# Patient Record
Sex: Male | Born: 1980 | Race: Black or African American | Hispanic: No | Marital: Single | Smoking: Current every day smoker
Health system: Southern US, Community
[De-identification: ages and names within clinical notes are randomized; demographics above are authoritative.]

## PROBLEM LIST (undated history)

## (undated) DIAGNOSIS — I509 Heart failure, unspecified: Secondary | ICD-10-CM

## (undated) DIAGNOSIS — I4891 Unspecified atrial fibrillation: Secondary | ICD-10-CM

## (undated) DIAGNOSIS — I10 Essential (primary) hypertension: Secondary | ICD-10-CM

---

## 1999-06-14 ENCOUNTER — Emergency Department (HOSPITAL_COMMUNITY): Admission: EM | Admit: 1999-06-14 | Discharge: 1999-06-14 | Payer: Self-pay | Admitting: Emergency Medicine

## 2000-09-08 ENCOUNTER — Emergency Department (HOSPITAL_COMMUNITY): Admission: EM | Admit: 2000-09-08 | Discharge: 2000-09-08 | Payer: Self-pay | Admitting: Podiatry

## 2003-03-24 ENCOUNTER — Emergency Department (HOSPITAL_COMMUNITY): Admission: EM | Admit: 2003-03-24 | Discharge: 2003-03-24 | Payer: Self-pay | Admitting: Emergency Medicine

## 2003-03-24 ENCOUNTER — Encounter: Payer: Self-pay | Admitting: Emergency Medicine

## 2003-08-04 ENCOUNTER — Emergency Department (HOSPITAL_COMMUNITY): Admission: EM | Admit: 2003-08-04 | Discharge: 2003-08-04 | Payer: Self-pay

## 2003-08-16 ENCOUNTER — Emergency Department (HOSPITAL_COMMUNITY): Admission: EM | Admit: 2003-08-16 | Discharge: 2003-08-16 | Payer: Self-pay | Admitting: Emergency Medicine

## 2006-09-14 ENCOUNTER — Emergency Department (HOSPITAL_COMMUNITY): Admission: EM | Admit: 2006-09-14 | Discharge: 2006-09-14 | Payer: Self-pay | Admitting: Emergency Medicine

## 2009-02-26 ENCOUNTER — Emergency Department (HOSPITAL_COMMUNITY): Admission: EM | Admit: 2009-02-26 | Discharge: 2009-02-26 | Payer: Self-pay | Admitting: Emergency Medicine

## 2011-06-23 ENCOUNTER — Emergency Department (HOSPITAL_COMMUNITY)
Admission: EM | Admit: 2011-06-23 | Discharge: 2011-06-23 | Disposition: A | Discharge status: Home / Self Care | Payer: Self-pay | Attending: Emergency Medicine | Admitting: Emergency Medicine

## 2011-06-23 DIAGNOSIS — H5789 Other specified disorders of eye and adnexa: Secondary | ICD-10-CM | POA: Insufficient documentation

## 2011-06-23 DIAGNOSIS — H109 Unspecified conjunctivitis: Secondary | ICD-10-CM | POA: Insufficient documentation

## 2012-05-08 ENCOUNTER — Emergency Department (HOSPITAL_COMMUNITY): Payer: Self-pay

## 2012-05-08 ENCOUNTER — Encounter (HOSPITAL_COMMUNITY): Payer: Self-pay | Admitting: *Deleted

## 2012-05-08 ENCOUNTER — Inpatient Hospital Stay (HOSPITAL_COMMUNITY)
Admission: EM | Admit: 2012-05-08 | Discharge: 2012-05-17 | DRG: 291 | Disposition: A | Discharge status: Home / Self Care | Payer: MEDICAID | Source: Ambulatory Visit | Attending: Cardiovascular Disease | Admitting: Cardiovascular Disease

## 2012-05-08 DIAGNOSIS — I4891 Unspecified atrial fibrillation: Secondary | ICD-10-CM

## 2012-05-08 DIAGNOSIS — D75839 Thrombocytosis, unspecified: Secondary | ICD-10-CM

## 2012-05-08 DIAGNOSIS — R799 Abnormal finding of blood chemistry, unspecified: Secondary | ICD-10-CM | POA: Diagnosis present

## 2012-05-08 DIAGNOSIS — G9340 Encephalopathy, unspecified: Secondary | ICD-10-CM

## 2012-05-08 DIAGNOSIS — I959 Hypotension, unspecified: Secondary | ICD-10-CM

## 2012-05-08 DIAGNOSIS — I509 Heart failure, unspecified: Secondary | ICD-10-CM | POA: Diagnosis present

## 2012-05-08 DIAGNOSIS — I426 Alcoholic cardiomyopathy: Secondary | ICD-10-CM | POA: Diagnosis present

## 2012-05-08 DIAGNOSIS — I079 Rheumatic tricuspid valve disease, unspecified: Secondary | ICD-10-CM | POA: Diagnosis present

## 2012-05-08 DIAGNOSIS — E872 Acidosis, unspecified: Secondary | ICD-10-CM | POA: Diagnosis present

## 2012-05-08 DIAGNOSIS — I5021 Acute systolic (congestive) heart failure: Principal | ICD-10-CM

## 2012-05-08 DIAGNOSIS — K7010 Alcoholic hepatitis without ascites: Secondary | ICD-10-CM

## 2012-05-08 DIAGNOSIS — E871 Hypo-osmolality and hyponatremia: Secondary | ICD-10-CM

## 2012-05-08 DIAGNOSIS — N179 Acute kidney failure, unspecified: Secondary | ICD-10-CM

## 2012-05-08 DIAGNOSIS — M6282 Rhabdomyolysis: Secondary | ICD-10-CM | POA: Diagnosis present

## 2012-05-08 DIAGNOSIS — D473 Essential (hemorrhagic) thrombocythemia: Secondary | ICD-10-CM

## 2012-05-08 DIAGNOSIS — F1210 Cannabis abuse, uncomplicated: Secondary | ICD-10-CM | POA: Diagnosis present

## 2012-05-08 DIAGNOSIS — I429 Cardiomyopathy, unspecified: Secondary | ICD-10-CM

## 2012-05-08 DIAGNOSIS — F17200 Nicotine dependence, unspecified, uncomplicated: Secondary | ICD-10-CM | POA: Diagnosis present

## 2012-05-08 DIAGNOSIS — J189 Pneumonia, unspecified organism: Secondary | ICD-10-CM

## 2012-05-08 DIAGNOSIS — F1020 Alcohol dependence, uncomplicated: Secondary | ICD-10-CM | POA: Diagnosis present

## 2012-05-08 DIAGNOSIS — J9601 Acute respiratory failure with hypoxia: Secondary | ICD-10-CM

## 2012-05-08 DIAGNOSIS — I059 Rheumatic mitral valve disease, unspecified: Secondary | ICD-10-CM | POA: Diagnosis present

## 2012-05-08 DIAGNOSIS — J9600 Acute respiratory failure, unspecified whether with hypoxia or hypercapnia: Secondary | ICD-10-CM | POA: Diagnosis present

## 2012-05-08 LAB — URINALYSIS, ROUTINE W REFLEX MICROSCOPIC
Glucose, UA: NEGATIVE mg/dL
Hgb urine dipstick: NEGATIVE
Ketones, ur: NEGATIVE mg/dL
Leukocytes, UA: NEGATIVE
Protein, ur: NEGATIVE mg/dL
pH: 5.5 (ref 5.0–8.0)

## 2012-05-08 LAB — DIFFERENTIAL
Basophils Absolute: 0.1 10*3/uL (ref 0.0–0.1)
Eosinophils Absolute: 0.1 10*3/uL (ref 0.0–0.7)
Eosinophils Relative: 0 % (ref 0–5)
Lymphocytes Relative: 23 % (ref 12–46)
Monocytes Absolute: 1.5 10*3/uL — ABNORMAL HIGH (ref 0.1–1.0)

## 2012-05-08 LAB — BASIC METABOLIC PANEL
BUN: 9 mg/dL (ref 6–23)
CO2: 18 mEq/L — ABNORMAL LOW (ref 19–32)
Calcium: 9 mg/dL (ref 8.4–10.5)
Chloride: 101 mEq/L (ref 96–112)
Creatinine, Ser: 0.74 mg/dL (ref 0.50–1.35)

## 2012-05-08 LAB — CBC
HCT: 40.2 % (ref 39.0–52.0)
MCH: 29.9 pg (ref 26.0–34.0)
MCHC: 34.3 g/dL (ref 30.0–36.0)
MCV: 87.2 fL (ref 78.0–100.0)
Platelets: 1198 10*3/uL (ref 150–400)
RDW: 13.7 % (ref 11.5–15.5)
WBC: 13.9 10*3/uL — ABNORMAL HIGH (ref 4.0–10.5)

## 2012-05-08 LAB — GLUCOSE, CAPILLARY: Glucose-Capillary: 104 mg/dL — ABNORMAL HIGH (ref 70–99)

## 2012-05-08 MED ORDER — DILTIAZEM HCL 25 MG/5ML IV SOLN
INTRAVENOUS | Status: AC
  Administered 2012-05-08: 20 mg via INTRAVENOUS
  Filled 2012-05-08: qty 5

## 2012-05-08 MED ORDER — SODIUM CHLORIDE 0.9 % IV BOLUS (SEPSIS)
1000.0000 mL | Freq: Once | INTRAVENOUS | Status: AC
  Administered 2012-05-08: 1000 mL via INTRAVENOUS

## 2012-05-08 MED ORDER — METOPROLOL TARTRATE 1 MG/ML IV SOLN
5.0000 mg | Freq: Once | INTRAVENOUS | Status: AC
  Administered 2012-05-08: 5 mg via INTRAVENOUS
  Filled 2012-05-08: qty 5

## 2012-05-08 MED ORDER — DILTIAZEM HCL 100 MG IV SOLR
5.0000 mg/h | INTRAVENOUS | Status: AC
  Administered 2012-05-08: 5 mg/h via INTRAVENOUS
  Filled 2012-05-08: qty 100

## 2012-05-08 MED ORDER — GLUCAGON HCL (RDNA) 1 MG IJ SOLR
INTRAMUSCULAR | Status: AC
  Administered 2012-05-08: 1 mg
  Filled 2012-05-08: qty 1

## 2012-05-08 MED ORDER — LORAZEPAM 2 MG/ML IJ SOLN
INTRAMUSCULAR | Status: AC
  Administered 2012-05-08: 1 mg
  Filled 2012-05-08: qty 1

## 2012-05-08 MED ORDER — DILTIAZEM HCL 50 MG/10ML IV SOLN
20.0000 mg | Freq: Once | INTRAVENOUS | Status: AC
  Administered 2012-05-08: 20 mg via INTRAVENOUS
  Filled 2012-05-08: qty 4

## 2012-05-08 NOTE — ED Notes (Signed)
Report to blue side pt moved.

## 2012-05-08 NOTE — ED Provider Notes (Signed)
History     CSN: [id number]  Arrival date & time 05/08/12  2012   First MD Initiated Contact with Patient 05/08/12 2203      Chief Complaint  Patient presents with  . Leg Swelling  . Cough    Patient is a 31 y.o. male presenting with cough.  Cough Pertinent negatives include no chest pain, no chills and no shortness of breath.    History provided by the patient. Patient is a 31 year old male with no significant past medical history who presents with complaints of lower leg swelling. Patient states that he began to notice swelling this morning and was concerned that he was having elevated blood pressure. Patient states he was seen recently for upper respiratory symptoms and pinkeye. Patient has been using Mucinex for the symptoms with some improvement. he denies any swelling like this in the past. He denies any pain or numbness in the feet. Patient denies any chest pain or shortness of breath. Patient does state he has occasional fast heartbeat. Patient also states that he has felt dehydrated. Patient admits to poor liquid intake. Patient does mention drinking beer daily. Symptoms are described as mild. he denies any other aggravating or alleviating factors.    History reviewed. No pertinent past medical history.  History reviewed. No pertinent past surgical history.  History reviewed. No pertinent family history.  History  Substance Use Topics  . Smoking status: Current Everyday Smoker  . Smokeless tobacco: Not on file  . Alcohol Use: Yes      Review of Systems  Constitutional: Negative for fever and chills.  Respiratory: Negative for cough and shortness of breath.   Cardiovascular: Positive for palpitations and leg swelling. Negative for chest pain.  Gastrointestinal: Negative for nausea, vomiting, abdominal pain and diarrhea.  Genitourinary: Negative for frequency, hematuria, decreased urine volume and difficulty urinating.    Allergies  Review of patient's allergies  indicates no known allergies.  Home Medications   Current Outpatient Rx  Name Route Sig Dispense Refill  . GUAIFENESIN ER 600 MG PO TB12 Oral Take 1,200 mg by mouth 2 (two) times daily.    . IBUPROFEN 200 MG PO TABS Oral Take 200 mg by mouth every 6 (six) hours as needed. For pain related to cough    . ADULT MULTIVITAMIN W/MINERALS CH Oral Take 1 tablet by mouth daily.      BP 137/84  Pulse 60  Temp(Src) 97.6 F (36.4 C) (Oral)  Resp 16  SpO2 100%  Physical Exam  Nursing note and vitals reviewed. Constitutional: He is oriented to person, place, and time. He appears well-developed and well-nourished. No distress.  HENT:  Head: Normocephalic and atraumatic.  Cardiovascular: An irregular rhythm present. Tachycardia present.   Pulmonary/Chest: Effort normal and breath sounds normal. No respiratory distress. He has no wheezes. He has no rales.  Abdominal: Soft. He exhibits no distension. There is no tenderness. There is no rebound and no guarding.  Musculoskeletal: He exhibits edema.       2+ pitting edema bilateral feet and ankles. Normal dorsal pedal pulses. Normal sensation.  Neurological: He is alert and oriented to person, place, and time.  Skin: Skin is warm. No rash noted.  Psychiatric: He has a normal mood and affect. His behavior is normal.    ED Course  Procedures  CRITICAL CARE Performed by: Angus Seller   Total critical care time: 60  Critical care time was exclusive of separately billable procedures and treating other patients.  Critical care  was necessary to treat or prevent imminent or life-threatening deterioration.  Critical care was time spent personally by me on the following activities: development of treatment plan with patient and/or surrogate as well as nursing, discussions with consultants, evaluation of patient's response to treatment, examination of patient, obtaining history from patient or surrogate, ordering and performing treatments and  interventions, ordering and review of laboratory studies, ordering and review of radiographic studies, pulse oximetry and re-evaluation of patient's condition.      Results for orders placed during the hospital encounter of 05/08/12  CBC      Component Value Range   WBC 13.9 (*) 4.0 - 10.5 (K/uL)   RBC 4.61  4.22 - 5.81 (MIL/uL)   Hemoglobin 13.8  13.0 - 17.0 (g/dL)   HCT 78.2  95.6 - 21.3 (%)   MCV 87.2  78.0 - 100.0 (fL)   MCH 29.9  26.0 - 34.0 (pg)   MCHC 34.3  30.0 - 36.0 (g/dL)   RDW 08.6  57.8 - 46.9 (%)   Platelets 1198 (*) 150 - 400 (K/uL)  DIFFERENTIAL      Component Value Range   Neutrophils Relative 66  43 - 77 (%)   Neutro Abs 9.2 (*) 1.7 - 7.7 (K/uL)   Lymphocytes Relative 23  12 - 46 (%)   Lymphs Abs 3.1  0.7 - 4.0 (K/uL)   Monocytes Relative 11  3 - 12 (%)   Monocytes Absolute 1.5 (*) 0.1 - 1.0 (K/uL)   Eosinophils Relative 0  0 - 5 (%)   Eosinophils Absolute 0.1  0.0 - 0.7 (K/uL)   Basophils Relative 0  0 - 1 (%)   Basophils Absolute 0.1  0.0 - 0.1 (K/uL)  BASIC METABOLIC PANEL      Component Value Range   Sodium 134 (*) 135 - 145 (mEq/L)   Potassium 4.4  3.5 - 5.1 (mEq/L)   Chloride 101  96 - 112 (mEq/L)   CO2 18 (*) 19 - 32 (mEq/L)   Glucose, Bld 131 (*) 70 - 99 (mg/dL)   BUN 9  6 - 23 (mg/dL)   Creatinine, Ser 6.29  0.50 - 1.35 (mg/dL)   Calcium 9.0  8.4 - 52.8 (mg/dL)   GFR calc non Af Amer >90  >90 (mL/min)   GFR calc Af Amer >90  >90 (mL/min)  URINALYSIS, ROUTINE W REFLEX MICROSCOPIC      Component Value Range   Color, Urine AMBER (*) YELLOW    APPearance CLOUDY (*) CLEAR    Specific Gravity, Urine 1.020  1.005 - 1.030    pH 5.5  5.0 - 8.0    Glucose, UA NEGATIVE  NEGATIVE (mg/dL)   Hgb urine dipstick NEGATIVE  NEGATIVE    Bilirubin Urine MODERATE (*) NEGATIVE    Ketones, ur NEGATIVE  NEGATIVE (mg/dL)   Protein, ur NEGATIVE  NEGATIVE (mg/dL)   Urobilinogen, UA 1.0  0.0 - 1.0 (mg/dL)   Nitrite NEGATIVE  NEGATIVE    Leukocytes, UA NEGATIVE   NEGATIVE   PROTIME-INR      Component Value Range   Prothrombin Time 17.7 (*) 11.6 - 15.2 (seconds)   INR 1.43  0.00 - 1.49   HEPATIC FUNCTION PANEL      Component Value Range   Total Protein 7.6  6.0 - 8.3 (g/dL)   Albumin 3.1 (*) 3.5 - 5.2 (g/dL)   AST 413 (*) 0 - 37 (U/L)   ALT 192 (*) 0 - 53 (U/L)   Alkaline Phosphatase  175 (*) 39 - 117 (U/L)   Total Bilirubin 2.6 (*) 0.3 - 1.2 (mg/dL)   Bilirubin, Direct 1.6 (*) 0.0 - 0.3 (mg/dL)   Indirect Bilirubin 1.0 (*) 0.3 - 0.9 (mg/dL)  CARDIAC PANEL(CRET KIN+CKTOT+MB+TROPI)      Component Value Range   Total CK 144  7 - 232 (U/L)   CK, MB 2.4  0.3 - 4.0 (ng/mL)   Troponin I <0.30  <0.30 (ng/mL)   Relative Index 1.7  0.0 - 2.5   APTT      Component Value Range   aPTT 35  24 - 37 (seconds)  GLUCOSE, CAPILLARY      Component Value Range   Glucose-Capillary 104 (*) 70 - 99 (mg/dL)  ETHANOL      Component Value Range   Alcohol, Ethyl (B) <11  0 - 11 (mg/dL)  POCT I-STAT 3, BLOOD GAS (G3+)      Component Value Range   pH, Arterial 7.262 (*) 7.350 - 7.450    pCO2 arterial 32.2 (*) 35.0 - 45.0 (mmHg)   pO2, Arterial 37.0 (*) 80.0 - 100.0 (mmHg)   Bicarbonate 14.5 (*) 20.0 - 24.0 (mEq/L)   TCO2 15  0 - 100 (mmol/L)   O2 Saturation 63.0     Acid-base deficit 11.0 (*) 0.0 - 2.0 (mmol/L)   Collection site RADIAL, ALLEN'S TEST ACCEPTABLE     Drawn by Operator     Sample type ARTERIAL     Comment MD NOTIFIED, SUGGEST RECOLLECT        Dg Chest Portable 1 View  05/09/2012  *RADIOLOGY REPORT*  Clinical Data: Cough, leg edema.  PORTABLE CHEST - 1 VIEW  Comparison: None.  Findings: Cardiomegaly.  Central vascular congestion. Moderate bibasilar and mild perihilar opacities.  Vascular cephalization. No pneumothorax.  No definite pleural effusion.  No acute osseous finding.  IMPRESSION: Cardiomegaly with central vascular congestion.  Mild perihilar and moderate bibasilar opacities may represent edema or infection.  Original Report Authenticated  By: Waneta Martins, M.D.     1. New onset a-fib       MDM  Patient seen and evaluated. Patient no acute distress. Patient with a fast irregular heartbeat. Will obtain EKG and lab work.   10:45 PM EKG shows A. fib with RVR. Will move patient   Patient without any conversion and still are in 150s. Patient has been seen and evaluated with Dr. Manus Gunning.  Spoke with Dr. Patricia Pesa critical care. They will see and admit patient but it may take him 1-2 hours before they're able to come see the patient. Would like a cardiac ICU bed.   Have also spoken with on-call cardiology. They will come and evaluate patient for possible consideration of cardioversion.  Dr. Algie Coffer has seen patient and will admit.    Date: 05/08/2012  Rate: 191  Rhythm: atrial fibrillation with RVR  QRS Axis: normal  Intervals: normal  ST/T Wave abnormalities: nonspecific ST/T changes  Conduction Disutrbances:none  Narrative Interpretation:   Old EKG Reviewed: none available     Angus Seller, [place] 05/09/12 [phone]

## 2012-05-08 NOTE — ED Notes (Signed)
Pt ekg noted to be in AFIB with RVR rate of 191

## 2012-05-08 NOTE — ED Notes (Addendum)
Pt presented with cough and rapid AFib accompanied by Triage nurse.GCS 15.

## 2012-05-08 NOTE — ED Notes (Signed)
Pt states cough and leg swelling for 2 days. Pt has yellow tinge to sclarea, abdomen distended, +2 edema noted bilaterial legs. Pt states he is a daily drinker. Urine tea colored.

## 2012-05-08 NOTE — ED Notes (Signed)
Pt states that he was worried about his blood pressure, due to noticing that both his ankles and feet were swollen. Pt states that he was seen here and had increased BP but has not been diagonised with HTN. Pt states that he has also been having a cough for the past week with taking mucinex with no relief.

## 2012-05-09 ENCOUNTER — Encounter (HOSPITAL_COMMUNITY): Payer: Self-pay | Admitting: *Deleted

## 2012-05-09 ENCOUNTER — Inpatient Hospital Stay (HOSPITAL_COMMUNITY): Payer: Self-pay

## 2012-05-09 LAB — POCT I-STAT 3, ART BLOOD GAS (G3+)
Acid-base deficit: 12 mmol/L — ABNORMAL HIGH (ref 0.0–2.0)
Bicarbonate: 14.5 mEq/L — ABNORMAL LOW (ref 20.0–24.0)
O2 Saturation: 90 %
TCO2: 15 mmol/L (ref 0–100)
pCO2 arterial: 32.2 mmHg — ABNORMAL LOW (ref 35.0–45.0)
pCO2 arterial: 25.1 mmHg — ABNORMAL LOW (ref 35.0–45.0)
pH, Arterial: 7.262 — ABNORMAL LOW (ref 7.350–7.450)
pO2, Arterial: 63 mmHg — ABNORMAL LOW (ref 80.0–100.0)

## 2012-05-09 LAB — HEPATIC FUNCTION PANEL
ALT: 192 U/L — ABNORMAL HIGH (ref 0–53)
AST: 233 U/L — ABNORMAL HIGH (ref 0–37)
Albumin: 3.1 g/dL — ABNORMAL LOW (ref 3.5–5.2)
Alkaline Phosphatase: 175 U/L — ABNORMAL HIGH (ref 39–117)
Total Protein: 7.6 g/dL (ref 6.0–8.3)

## 2012-05-09 LAB — CBC
MCH: 29.5 pg (ref 26.0–34.0)
Platelets: 1040 10*3/uL (ref 150–400)
RBC: 4.38 MIL/uL (ref 4.22–5.81)
RDW: 13.7 % (ref 11.5–15.5)
WBC: 16.6 10*3/uL — ABNORMAL HIGH (ref 4.0–10.5)

## 2012-05-09 LAB — RAPID URINE DRUG SCREEN, HOSP PERFORMED
Cocaine: NOT DETECTED
Opiates: NOT DETECTED

## 2012-05-09 LAB — CARDIAC PANEL(CRET KIN+CKTOT+MB+TROPI)
CK, MB: 2.5 ng/mL (ref 0.3–4.0)
CK, MB: 2.8 ng/mL (ref 0.3–4.0)
Relative Index: 1.7 (ref 0.0–2.5)
Relative Index: 1.2 (ref 0.0–2.5)
Total CK: 206 U/L (ref 7–232)
Troponin I: <0.3 ng/mL (ref <0.30)
Troponin I: <0.3 ng/mL (ref <0.30)
Troponin I: <0.3 ng/mL (ref <0.30)
Troponin I: <0.3 ng/mL (ref <0.30)

## 2012-05-09 LAB — PRO B NATRIURETIC PEPTIDE: Pro B Natriuretic peptide (BNP): 2344 pg/mL — ABNORMAL HIGH (ref 0–125)

## 2012-05-09 LAB — BASIC METABOLIC PANEL
Chloride: 101 mEq/L (ref 96–112)
GFR calc Af Amer: >90 mL/min (ref >90)
GFR calc non Af Amer: >90 mL/min (ref >90)
Potassium: 4.2 mEq/L (ref 3.5–5.1)
Sodium: 137 mEq/L (ref 135–145)

## 2012-05-09 LAB — MRSA PCR SCREENING: MRSA by PCR: NEGATIVE

## 2012-05-09 LAB — APTT: aPTT: 35 seconds (ref 24–37)

## 2012-05-09 MED ORDER — METOPROLOL TARTRATE 1 MG/ML IV SOLN
5.0000 mg | Freq: Once | INTRAVENOUS | Status: AC
  Administered 2012-05-09: 5 mg via INTRAVENOUS
  Filled 2012-05-09: qty 5

## 2012-05-09 MED ORDER — DIGOXIN 0.25 MG/ML IJ SOLN
0.2500 mg | Freq: Once | INTRAMUSCULAR | Status: AC
  Administered 2012-05-09: 0.25 mg via INTRAVENOUS

## 2012-05-09 MED ORDER — SODIUM CHLORIDE 0.9 % IJ SOLN: 3.0000 mL | INTRAMUSCULAR | Status: DC | PRN

## 2012-05-09 MED ORDER — DIGOXIN 125 MCG PO TABS
0.1250 mg | ORAL_TABLET | Freq: Every day | ORAL | Status: DC
  Filled 2012-05-09: qty 1

## 2012-05-09 MED ORDER — SODIUM CHLORIDE 0.9 % IJ SOLN
3.0000 mL | Freq: Two times a day (BID) | INTRAMUSCULAR | Status: DC
  Administered 2012-05-09: 3 mL via INTRAVENOUS
  Administered 2012-05-10: 10:00:00 via INTRAVENOUS
  Administered 2012-05-10 – 2012-05-13 (×7): 3 mL via INTRAVENOUS

## 2012-05-09 MED ORDER — DIGOXIN 0.25 MG/ML IJ SOLN
0.2500 mg | Freq: Every day | INTRAMUSCULAR | Status: AC
  Administered 2012-05-09: 0.25 mg via INTRAVENOUS

## 2012-05-09 MED ORDER — VITAMIN B-1 100 MG PO TABS
100.0000 mg | ORAL_TABLET | Freq: Every day | ORAL | Status: DC
  Administered 2012-05-09 – 2012-05-10 (×2): 100 mg via ORAL
  Filled 2012-05-09 (×2): qty 1

## 2012-05-09 MED ORDER — DILTIAZEM HCL 60 MG PO TABS
60.0000 mg | ORAL_TABLET | Freq: Four times a day (QID) | ORAL | Status: DC
  Administered 2012-05-09 (×2): 60 mg via ORAL
  Filled 2012-05-09 (×4): qty 1

## 2012-05-09 MED ORDER — SODIUM CHLORIDE 0.9 % IV BOLUS (SEPSIS)
250.0000 mL | Freq: Once | INTRAVENOUS | Status: AC
  Administered 2012-05-09: 250 mL via INTRAVENOUS

## 2012-05-09 MED ORDER — LORAZEPAM 2 MG/ML IJ SOLN
1.0000 mg | Freq: Four times a day (QID) | INTRAMUSCULAR | Status: DC | PRN
  Administered 2012-05-10: 13:00:00 via INTRAVENOUS
  Filled 2012-05-09 (×3): qty 1

## 2012-05-09 MED ORDER — LORAZEPAM 1 MG PO TABS
1.0000 mg | ORAL_TABLET | Freq: Four times a day (QID) | ORAL | Status: DC | PRN
  Administered 2012-05-09: 1 mg via ORAL
  Filled 2012-05-09: qty 1

## 2012-05-09 MED ORDER — FOLIC ACID 1 MG PO TABS
1.0000 mg | ORAL_TABLET | Freq: Every day | ORAL | Status: DC
  Administered 2012-05-09 – 2012-05-10 (×2): 1 mg via ORAL
  Filled 2012-05-09 (×2): qty 1

## 2012-05-09 MED ORDER — DIGOXIN 250 MCG PO TABS
0.2500 mg | ORAL_TABLET | Freq: Every day | ORAL | Status: DC
  Filled 2012-05-09: qty 1

## 2012-05-09 MED ORDER — LISINOPRIL 2.5 MG PO TABS
2.5000 mg | ORAL_TABLET | Freq: Every day | ORAL | Status: DC
  Administered 2012-05-09: 2.5 mg via ORAL
  Filled 2012-05-09: qty 1

## 2012-05-09 MED ORDER — LORAZEPAM 2 MG/ML IJ SOLN: 0.0000 mg | Freq: Two times a day (BID) | INTRAMUSCULAR | Status: DC

## 2012-05-09 MED ORDER — ENOXAPARIN SODIUM 40 MG/0.4ML ~~LOC~~ SOLN
40.0000 mg | SUBCUTANEOUS | Status: DC
  Filled 2012-05-09 (×2): qty 0.4

## 2012-05-09 MED ORDER — ACETAMINOPHEN 325 MG PO TABS: 650.0000 mg | ORAL_TABLET | ORAL | Status: DC | PRN

## 2012-05-09 MED ORDER — FUROSEMIDE 10 MG/ML IJ SOLN
20.0000 mg | Freq: Two times a day (BID) | INTRAMUSCULAR | Status: DC
  Administered 2012-05-09: 20 mg via INTRAVENOUS
  Filled 2012-05-09 (×3): qty 2

## 2012-05-09 MED ORDER — SODIUM CHLORIDE 0.9 % IV SOLN
250.0000 mL | INTRAVENOUS | Status: DC | PRN
  Administered 2012-05-09 (×2): 250 mL via INTRAVENOUS

## 2012-05-09 MED ORDER — FUROSEMIDE 10 MG/ML IJ SOLN
80.0000 mg | Freq: Once | INTRAMUSCULAR | Status: AC
  Administered 2012-05-09: 80 mg via INTRAVENOUS
  Filled 2012-05-09: qty 8

## 2012-05-09 MED ORDER — DIGOXIN 0.25 MG/ML IJ SOLN
INTRAMUSCULAR | Status: AC
  Filled 2012-05-09: qty 2

## 2012-05-09 MED ORDER — PNEUMOCOCCAL VAC POLYVALENT 25 MCG/0.5ML IJ INJ
0.5000 mL | INJECTION | INTRAMUSCULAR | Status: AC
  Administered 2012-05-10: 0.5 mL via INTRAMUSCULAR
  Filled 2012-05-09: qty 0.5

## 2012-05-09 MED ORDER — ADULT MULTIVITAMIN W/MINERALS CH
1.0000 | ORAL_TABLET | Freq: Every day | ORAL | Status: DC
  Administered 2012-05-09 – 2012-05-10 (×2): 1 via ORAL
  Filled 2012-05-09 (×2): qty 1

## 2012-05-09 MED ORDER — ONDANSETRON HCL 4 MG/2ML IJ SOLN
4.0000 mg | Freq: Four times a day (QID) | INTRAMUSCULAR | Status: DC | PRN
  Administered 2012-05-10: 4 mg via INTRAVENOUS
  Filled 2012-05-09: qty 2

## 2012-05-09 MED ORDER — LORAZEPAM 2 MG/ML IJ SOLN
0.0000 mg | Freq: Four times a day (QID) | INTRAMUSCULAR | Status: AC
  Administered 2012-05-09 (×2): 2 mg via INTRAVENOUS
  Administered 2012-05-09: 1 mg via INTRAVENOUS
  Administered 2012-05-10: 2 mg via INTRAVENOUS
  Filled 2012-05-09 (×3): qty 1

## 2012-05-09 MED ORDER — ASPIRIN EC 81 MG PO TBEC
81.0000 mg | DELAYED_RELEASE_TABLET | Freq: Every day | ORAL | Status: DC
  Administered 2012-05-09 – 2012-05-11 (×3): 81 mg via ORAL
  Filled 2012-05-09 (×4): qty 1

## 2012-05-09 MED ORDER — DILTIAZEM HCL 30 MG PO TABS
30.0000 mg | ORAL_TABLET | Freq: Four times a day (QID) | ORAL | Status: DC
  Administered 2012-05-10: 30 mg via ORAL
  Filled 2012-05-09 (×5): qty 1

## 2012-05-09 MED ORDER — SODIUM CHLORIDE 0.9 % IV BOLUS (SEPSIS)
1000.0000 mL | Freq: Once | INTRAVENOUS | Status: AC
  Administered 2012-05-09: 1000 mL via INTRAVENOUS

## 2012-05-09 MED ORDER — POTASSIUM CHLORIDE CRYS ER 20 MEQ PO TBCR
20.0000 meq | EXTENDED_RELEASE_TABLET | Freq: Every day | ORAL | Status: DC
  Administered 2012-05-09 – 2012-05-10 (×2): 20 meq via ORAL
  Filled 2012-05-09 (×2): qty 1

## 2012-05-09 MED ORDER — THIAMINE HCL 100 MG/ML IJ SOLN
100.0000 mg | Freq: Every day | INTRAMUSCULAR | Status: DC
  Filled 2012-05-09 (×2): qty 1

## 2012-05-09 MED ORDER — SODIUM CHLORIDE 0.9 % IV SOLN
Freq: Once | INTRAVENOUS | Status: AC
  Administered 2012-05-09: 23:00:00 via INTRAVENOUS

## 2012-05-09 MED ORDER — DILTIAZEM HCL 25 MG/5ML IV SOLN
10.0000 mg | Freq: Once | INTRAVENOUS | Status: AC
  Administered 2012-05-09: 10 mg via INTRAVENOUS
  Filled 2012-05-09: qty 5

## 2012-05-09 MED ORDER — CARVEDILOL 3.125 MG PO TABS
3.1250 mg | ORAL_TABLET | Freq: Two times a day (BID) | ORAL | Status: DC
  Administered 2012-05-09 – 2012-05-17 (×18): 3.125 mg via ORAL
  Filled 2012-05-09 (×20): qty 1

## 2012-05-09 MED ORDER — DILTIAZEM HCL 100 MG IV SOLR
10.0000 mg/h | INTRAVENOUS | Status: DC
  Administered 2012-05-09 (×2): 10 mg/h via INTRAVENOUS
  Filled 2012-05-09: qty 100

## 2012-05-09 NOTE — H&P (Signed)
Matthew Joseph is an 31 y.o. male.   Chief Complaint: Leg swelling and cough. HPI: 31 years old black male with h/o alcohol abuse has leg edema and cough worsening over few days. No fever. No chest pain.  Past medical history:-No diabetes, no hypertension, + smoking, + alcohol, no hyperlipidemia, No CVA, No obesity, No exercise.  Past surgical history:- None  Family history: Mom, living, 57 years and has hypertension. Dad-living, 58 years has hypertension. One brother, 81 years old. No sister.    Social History:  reports that he has been smoking.  He does not have any smokeless tobacco history on file. He reports that he drinks alcohol. He reports that he does use illicit drugs.   Personal: Single, One son and four daughters. 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  Allergies: No Known Allergies   Results for orders placed during the hospital encounter of 05/08/12 (from the past 48 hour(s))  CBC     Status: Abnormal   Collection Time   05/08/12 10:31 PM      Component Value Range Comment   WBC 13.9 (*) 4.0 - 10.5 (K/uL)    RBC 4.61  4.22 - 5.81 (MIL/uL)    Hemoglobin 13.8  13.0 - 17.0 (g/dL)    HCT 16.1  09.6 - 04.5 (%)    MCV 87.2  78.0 - 100.0 (fL)    MCH 29.9  26.0 - 34.0 (pg)    MCHC 34.3  30.0 - 36.0 (g/dL)    RDW 40.9  81.1 - 91.4 (%)    Platelets 1198 (*) 150 - 400 (K/uL)   DIFFERENTIAL     Status: Abnormal   Collection Time   05/08/12 10:31 PM      Component Value Range Comment   Neutrophils Relative 66  43 - 77 (%)    Neutro Abs 9.2 (*) 1.7 - 7.7 (K/uL)    Lymphocytes Relative 23  12 - 46 (%)    Lymphs Abs 3.1  0.7 - 4.0 (K/uL)    Monocytes Relative 11  3 - 12 (%)    Monocytes Absolute 1.5 (*) 0.1 - 1.0 (K/uL)    Eosinophils Relative 0  0 - 5 (%)    Eosinophils Absolute 0.1  0.0 - 0.7 (K/uL)    Basophils Relative 0  0 - 1 (%)    Basophils Absolute 0.1  0.0 - 0.1 (K/uL)   BASIC METABOLIC PANEL     Status: Abnormal   Collection Time   05/08/12 10:31 PM   Component Value Range Comment   Sodium 134 (*) 135 - 145 (mEq/L)    Potassium 4.4  3.5 - 5.1 (mEq/L)    Chloride 101  96 - 112 (mEq/L)    CO2 18 (*) 19 - 32 (mEq/L)    Glucose, Bld 131 (*) 70 - 99 (mg/dL)    BUN 9  6 - 23 (mg/dL)    Creatinine, Ser 7.82  0.50 - 1.35 (mg/dL)    Calcium 9.0  8.4 - 10.5 (mg/dL)    GFR calc non Af Amer >90  >90 (mL/min)    GFR calc Af Amer >90  >90 (mL/min)   URINALYSIS, ROUTINE W REFLEX MICROSCOPIC     Status: Abnormal   Collection Time   05/08/12 10:37 PM      Component Value Range Comment   Color, Urine AMBER (*) YELLOW  BIOCHEMICALS MAY BE AFFECTED BY COLOR   APPearance CLOUDY (*) CLEAR     Specific Gravity, Urine 1.020  1.005 - 1.030     pH 5.5  5.0 - 8.0     Glucose, UA NEGATIVE  NEGATIVE (mg/dL)    Hgb urine dipstick NEGATIVE  NEGATIVE     Bilirubin Urine MODERATE (*) NEGATIVE     Ketones, ur NEGATIVE  NEGATIVE (mg/dL)    Protein, ur NEGATIVE  NEGATIVE (mg/dL)    Urobilinogen, UA 1.0  0.0 - 1.0 (mg/dL)    Nitrite NEGATIVE  NEGATIVE     Leukocytes, UA NEGATIVE  NEGATIVE  MICROSCOPIC NOT DONE ON URINES WITH NEGATIVE PROTEIN, BLOOD, LEUKOCYTES, NITRITE, OR GLUCOSE <1000 mg/dL.  PROTIME-INR     Status: Abnormal   Collection Time   05/08/12 11:27 PM      Component Value Range Comment   Prothrombin Time 17.7 (*) 11.6 - 15.2 (seconds)    INR 1.43  0.00 - 1.49    HEPATIC FUNCTION PANEL     Status: Abnormal   Collection Time   05/08/12 11:27 PM      Component Value Range Comment   Total Protein 7.6  6.0 - 8.3 (g/dL)    Albumin 3.1 (*) 3.5 - 5.2 (g/dL)    AST 161 (*) 0 - 37 (U/L)    ALT 192 (*) 0 - 53 (U/L)    Alkaline Phosphatase 175 (*) 39 - 117 (U/L)    Total Bilirubin 2.6 (*) 0.3 - 1.2 (mg/dL)    Bilirubin, Direct 1.6 (*) 0.0 - 0.3 (mg/dL)    Indirect Bilirubin 1.0 (*) 0.3 - 0.9 (mg/dL)   CARDIAC PANEL(CRET KIN+CKTOT+MB+TROPI)     Status: Normal   Collection Time   05/08/12 11:27 PM      Component Value Range Comment   Total CK 144  7 - 232  (U/L)    CK, MB 2.4  0.3 - 4.0 (ng/mL)    Troponin I <0.30  <0.30 (ng/mL)    Relative Index 1.7  0.0 - 2.5    APTT     Status: Normal   Collection Time   05/08/12 11:27 PM      Component Value Range Comment   aPTT 35  24 - 37 (seconds)   GLUCOSE, CAPILLARY     Status: Abnormal   Collection Time   05/08/12 11:50 PM      Component Value Range Comment   Glucose-Capillary 104 (*) 70 - 99 (mg/dL)   ETHANOL     Status: Normal   Collection Time   05/09/12 12:22 AM      Component Value Range Comment   Alcohol, Ethyl (B) <11  0 - 11 (mg/dL)   POCT I-STAT 3, BLOOD GAS (G3+)     Status: Abnormal   Collection Time   05/09/12  1:35 AM      Component Value Range Comment   pH, Arterial 7.262 (*) 7.350 - 7.450     pCO2 arterial 32.2 (*) 35.0 - 45.0 (mmHg)    pO2, Arterial 37.0 (*) 80.0 - 100.0 (mmHg)    Bicarbonate 14.5 (*) 20.0 - 24.0 (mEq/L)    TCO2 15  0 - 100 (mmol/L)    O2 Saturation 63.0      Acid-base deficit 11.0 (*) 0.0 - 2.0 (mmol/L)    Collection site RADIAL, ALLEN'S TEST ACCEPTABLE      Drawn by Operator      Sample type ARTERIAL      Comment MD NOTIFIED, SUGGEST RECOLLECT      Dg Chest Portable 1 View  05/09/2012  *RADIOLOGY REPORT*  Clinical  Data: Cough, leg edema.  PORTABLE CHEST - 1 VIEW  Comparison: None.  Findings: Cardiomegaly.  Central vascular congestion. Moderate bibasilar and mild perihilar opacities.  Vascular cephalization. No pneumothorax.  No definite pleural effusion.  No acute osseous finding.  IMPRESSION: Cardiomegaly with central vascular congestion.  Mild perihilar and moderate bibasilar opacities may represent edema or infection.  Original Report Authenticated By: Waneta Martins, M.D.    @ROS @ No vision change, No hearing loss, No weight gain or loss, no asthma, No chest pain, no pneumonia, no GI bleed, no hepatitis, no kidney stone, No CVA, seizures, or psych admission.  Blood pressure 107/73, pulse 62, temperature 97.6 F (36.4 C), temperature source Oral,  resp. rate 47, SpO2 100.00%. General appearance: cooperative and fatigued Head: Normocephalic, without obvious abnormality, atraumatic Eyes: Brown,conjunctivae/corneas clear. PERRL, EOM's intact. Throat: lips, mucosa, and tongue normal; teeth and gums normal Neck: no adenopathy, no carotid bruit, no JVD, supple, symmetrical, trachea midline and thyroid not enlarged, symmetric Resp: rhonchi bibasilar Cardio: irregularly irregular rhythm. S1, S2 normal. GI: soft, non-tender; bowel sounds normal. Extremities: extremities normal, atraumatic, no cyanosis. 1 + edema Neurologic: Alert and oriented X 3, normal strength and tone. Normal symmetric reflexes. Normal coordination and gait  Assessment/Plan Acute left heart systolic failure Atrial fibrillation H/O smoking H/O alcohol use disorder H/O drug use  Plan: IV lanoxin, cardizem and lopressor as tolerated Step-down. Echocardiogram Oxygen IV Lasix as tolerated  Adric Wrede S 05/09/2012, 2:53 AM

## 2012-05-09 NOTE — ED Notes (Signed)
Pt looked flushed and complained of feeling unwell.EKG and CBG done.Seen by ED doctor and medications given as ordered.

## 2012-05-09 NOTE — ED Provider Notes (Signed)
Medical screening examination/treatment/procedure(s) were conducted as a shared visit with non-physician practitioner(s) and myself.  I personally evaluated the patient during the encounter  Leg swelling and cough x 2 days. Found to be in Afib with RVR. Bibasilar crackles.  IV cardizem controlling rates to only 140s.  BP ok.  Cardiomegaly with congestions. Lasix given. Remains tachypneic in 50s without hypoxia. Suspect component of alcohol withdrawal (72 ounces of beer daily, none since yesterday). Ativan given. bipap started with decrease in work of breathing. Cardioversion not attempted given unknown time of onset and stability of blood pressure. D/w CCM and Cardiology Dr. Algie Coffer.  CRITICAL CARE Performed by: Glynn Octave   Total critical care time: 60  Critical care time was exclusive of separately billable procedures and treating other patients.  Critical care was necessary to treat or prevent imminent or life-threatening deterioration.  Critical care was time spent personally by me on the following activities: development of treatment plan with patient and/or surrogate as well as nursing, discussions with consultants, evaluation of patient's response to treatment, examination of patient, obtaining history from patient or surrogate, ordering and performing treatments and interventions, ordering and review of laboratory studies, ordering and review of radiographic studies, pulse oximetry and re-evaluation of patient's condition.   Glynn Octave, MD 05/09/12 1204

## 2012-05-09 NOTE — Progress Notes (Signed)
Subjective:  Resting comfortably. HR in 90's. Awaiting 2-D echo.  Objective:  Vital Signs in the last 24 hours: Temp:  [97.6 F (36.4 C)-97.8 F (36.6 C)] 97.8 F (36.6 C) (06/09 0744) Pulse Rate:  [42-203] 45  (06/09 0700) Cardiac Rhythm:  [-] Atrial fibrillation (06/09 0419) Resp:  [16-54] 30  (06/09 0700) BP: (84-137)/(18-101) 109/71 mmHg (06/09 0607) SpO2:  [98 %-100 %] 98 % (06/09 0700) FiO2 (%):  [30 %] 30 % (06/09 0600) Weight:  [67.1 kg (147 lb 14.9 oz)] 67.1 kg (147 lb 14.9 oz) (06/09 0407)  Physical Exam: BP Readings from Last 1 Encounters:  05/09/12 109/71    Wt Readings from Last 1 Encounters:  05/09/12 67.1 kg (147 lb 14.9 oz)    Weight change:   HEENT: [place]/AT, Eyes-Brown, PERL, EOMI, Conjunctiva-Pink, Sclera-Non-icteric Neck: No JVD, No bruit, Trachea midline. Lungs:  Clearing, Bilateral. Cardiac:  Regular rhythm, normal S1 and S2, no S3.  Abdomen:  Soft, non-tender. Extremities:  1 + edema present. No cyanosis. No clubbing. CNS: AxOx3, Cranial nerves grossly intact, moves all 4 extremities. Right handed. Skin: Warm and dry.   Intake/Output from previous day: 06/08 0701 - 06/09 0700 In: 60 [I.V.:60] Out: 3475 [Urine:3475]    Lab Results: BMET    Component Value Date/Time   NA 134* 05/08/2012 2231   K 4.4 05/08/2012 2231   CL 101 05/08/2012 2231   CO2 18* 05/08/2012 2231   GLUCOSE 131* 05/08/2012 2231   BUN 9 05/08/2012 2231   CREATININE 0.74 05/08/2012 2231   CALCIUM 9.0 05/08/2012 2231   GFRNONAA >90 05/08/2012 2231   GFRAA >90 05/08/2012 2231   CBC    Component Value Date/Time   WBC 13.9* 05/08/2012 2231   RBC 4.61 05/08/2012 2231   HGB 13.8 05/08/2012 2231   HCT 40.2 05/08/2012 2231   PLT 1198* 05/08/2012 2231   MCV 87.2 05/08/2012 2231   MCH 29.9 05/08/2012 2231   MCHC 34.3 05/08/2012 2231   RDW 13.7 05/08/2012 2231   LYMPHSABS 3.1 05/08/2012 2231   MONOABS 1.5* 05/08/2012 2231   EOSABS 0.1 05/08/2012 2231   BASOSABS 0.1 05/08/2012 2231   CARDIAC ENZYMES Lab Results    Component Value Date   CKTOTAL 144 05/08/2012   CKMB 2.4 05/08/2012   TROPONINI <0.30 05/08/2012    Assessment/Plan:  Patient Active Hospital Problem List:  Acute left heart systolic failure  Atrial fibrillation  H/O smoking  H/O alcohol use disorder  H/O drug use  Adjust medications and check lab.   LOS: 1 day    Orpah Cobb  MD  05/09/2012, 8:54 AM

## 2012-05-09 NOTE — Progress Notes (Signed)
Pt is wanting to leave the hospital; Dr. Algie Coffer notified; pt has pulled out 3 IVs because he feels tied down and has been wanting to leave since about 2100; I have tried to reason with the patient to remain in hospital for treatment; pt is insisting on leaving; he is irritated by all the wires that are attached to him, but he is not confused; pt is alert and oriented x4; pt in agreement for me to call his mother; mother contacted, Fredrik Rigger, she doesn't want him to leave, but explained that I could not legally keep him against his will; she is going to call him; pt wants to call his mom; I called his mother from the room and let him talk with her.  Pt is currently talking with his mom; pt attempted to get up to get dressed and stumbled; pt then in agreement he was not able to walk out on his own; pt for the moment has agreed to stay

## 2012-05-09 NOTE — ED Notes (Signed)
Pt feeling better and with GCS 15.

## 2012-05-10 ENCOUNTER — Inpatient Hospital Stay (HOSPITAL_COMMUNITY): Payer: Self-pay

## 2012-05-10 DIAGNOSIS — I5021 Acute systolic (congestive) heart failure: Principal | ICD-10-CM

## 2012-05-10 DIAGNOSIS — N179 Acute kidney failure, unspecified: Secondary | ICD-10-CM

## 2012-05-10 DIAGNOSIS — J9600 Acute respiratory failure, unspecified whether with hypoxia or hypercapnia: Secondary | ICD-10-CM

## 2012-05-10 DIAGNOSIS — G9340 Encephalopathy, unspecified: Secondary | ICD-10-CM

## 2012-05-10 DIAGNOSIS — J189 Pneumonia, unspecified organism: Secondary | ICD-10-CM

## 2012-05-10 DIAGNOSIS — D473 Essential (hemorrhagic) thrombocythemia: Secondary | ICD-10-CM

## 2012-05-10 DIAGNOSIS — K7010 Alcoholic hepatitis without ascites: Secondary | ICD-10-CM

## 2012-05-10 DIAGNOSIS — I959 Hypotension, unspecified: Secondary | ICD-10-CM

## 2012-05-10 LAB — CARDIAC PANEL(CRET KIN+CKTOT+MB+TROPI)
CK, MB: 3.1 ng/mL (ref 0.3–4.0)
Relative Index: 1.5 (ref 0.0–2.5)
Total CK: 204 U/L (ref 7–232)
Troponin I: <0.3 ng/mL (ref <0.30)

## 2012-05-10 LAB — CBC
MCHC: 32.8 g/dL (ref 30.0–36.0)
Platelets: 953 10*3/uL (ref 150–400)
RDW: 14.2 % (ref 11.5–15.5)
WBC: 23.5 10*3/uL — ABNORMAL HIGH (ref 4.0–10.5)

## 2012-05-10 LAB — BASIC METABOLIC PANEL
BUN: 19 mg/dL (ref 6–23)
BUN: 25 mg/dL — ABNORMAL HIGH (ref 6–23)
Calcium: 8.5 mg/dL (ref 8.4–10.5)
Chloride: 97 mEq/L (ref 96–112)
Creatinine, Ser: 1.93 mg/dL — ABNORMAL HIGH (ref 0.50–1.35)
Creatinine, Ser: 1.48 mg/dL — ABNORMAL HIGH (ref 0.50–1.35)
GFR calc Af Amer: 51 mL/min — ABNORMAL LOW (ref >90)
GFR calc Af Amer: 70 mL/min — ABNORMAL LOW (ref >90)
GFR calc non Af Amer: 61 mL/min — ABNORMAL LOW (ref >90)

## 2012-05-10 LAB — HEPARIN LEVEL (UNFRACTIONATED): Heparin Unfractionated: 0.86 IU/mL — ABNORMAL HIGH (ref 0.30–0.70)

## 2012-05-10 MED ORDER — PIPERACILLIN-TAZOBACTAM 3.375 G IVPB 30 MIN
3.3750 g | Freq: Three times a day (TID) | INTRAVENOUS | Status: DC
  Filled 2012-05-10 (×2): qty 50

## 2012-05-10 MED ORDER — DEXTROSE-NACL 5-0.45 % IV SOLN
INTRAVENOUS | Status: DC
  Administered 2012-05-10 – 2012-05-16 (×9): via INTRAVENOUS

## 2012-05-10 MED ORDER — SODIUM BICARBONATE 8.4 % IV SOLN
50.0000 meq | Freq: Once | INTRAVENOUS | Status: AC
  Administered 2012-05-11: 50 meq via INTRAVENOUS
  Filled 2012-05-10 (×2): qty 50

## 2012-05-10 MED ORDER — DIGOXIN 0.0625 MG HALF TABLET
0.0625 mg | ORAL_TABLET | Freq: Every day | ORAL | Status: DC
  Administered 2012-05-10 – 2012-05-17 (×8): 0.0625 mg via ORAL
  Filled 2012-05-10 (×8): qty 1

## 2012-05-10 MED ORDER — SODIUM CHLORIDE 0.9 % IV BOLUS (SEPSIS)
250.0000 mL | Freq: Once | INTRAVENOUS | Status: AC
  Administered 2012-05-10: via INTRAVENOUS

## 2012-05-10 MED ORDER — HALOPERIDOL LACTATE 5 MG/ML IJ SOLN
1.0000 mg | Freq: Four times a day (QID) | INTRAMUSCULAR | Status: DC | PRN
  Filled 2012-05-10: qty 1

## 2012-05-10 MED ORDER — HALOPERIDOL 1 MG PO TABS
1.0000 mg | ORAL_TABLET | Freq: Once | ORAL | Status: AC
  Administered 2012-05-10: 1 mg via ORAL
  Filled 2012-05-10: qty 1

## 2012-05-10 MED ORDER — SODIUM BICARBONATE 8.4 % IV SOLN
INTRAVENOUS | Status: AC
  Administered 2012-05-10: 50 meq via INTRAVENOUS
  Filled 2012-05-10: qty 50

## 2012-05-10 MED ORDER — VANCOMYCIN HCL 1000 MG IV SOLR
750.0000 mg | Freq: Two times a day (BID) | INTRAVENOUS | Status: DC
  Administered 2012-05-10 – 2012-05-11 (×4): 750 mg via INTRAVENOUS
  Filled 2012-05-10 (×6): qty 750

## 2012-05-10 MED ORDER — SODIUM CHLORIDE 0.9 % IV BOLUS (SEPSIS): 250.0000 mL | Freq: Once | INTRAVENOUS | Status: DC

## 2012-05-10 MED ORDER — PIPERACILLIN-TAZOBACTAM 3.375 G IVPB
3.3750 g | Freq: Three times a day (TID) | INTRAVENOUS | Status: DC
  Filled 2012-05-10: qty 50

## 2012-05-10 MED ORDER — HEPARIN BOLUS VIA INFUSION
4000.0000 [IU] | Freq: Once | INTRAVENOUS | Status: AC
  Administered 2012-05-10: 4000 [IU] via INTRAVENOUS
  Filled 2012-05-10: qty 4000

## 2012-05-10 MED ORDER — BIOTENE DRY MOUTH MT LIQD
15.0000 mL | Freq: Two times a day (BID) | OROMUCOSAL | Status: DC
  Administered 2012-05-10 – 2012-05-17 (×11): 15 mL via OROMUCOSAL

## 2012-05-10 MED ORDER — SODIUM BICARBONATE 8.4 % IV SOLN
50.0000 meq | Freq: Once | INTRAVENOUS | Status: AC
  Administered 2012-05-10: 50 meq via INTRAVENOUS
  Filled 2012-05-10: qty 50

## 2012-05-10 MED ORDER — PIPERACILLIN-TAZOBACTAM 3.375 G IVPB
3.3750 g | Freq: Three times a day (TID) | INTRAVENOUS | Status: DC
  Administered 2012-05-10 – 2012-05-17 (×21): 3.375 g via INTRAVENOUS
  Filled 2012-05-10 (×26): qty 50

## 2012-05-10 MED ORDER — DILTIAZEM HCL 60 MG PO TABS
60.0000 mg | ORAL_TABLET | Freq: Three times a day (TID) | ORAL | Status: DC
  Administered 2012-05-10: 60 mg via ORAL
  Filled 2012-05-10 (×3): qty 1

## 2012-05-10 MED ORDER — HEPARIN (PORCINE) IN NACL 100-0.45 UNIT/ML-% IJ SOLN
900.0000 [IU]/h | INTRAMUSCULAR | Status: DC
  Administered 2012-05-10 (×3): 950 [IU]/h via INTRAVENOUS
  Administered 2012-05-11: 900 [IU]/h via INTRAVENOUS
  Filled 2012-05-10 (×3): qty 250

## 2012-05-10 NOTE — Care Management Note (Addendum)
    Page 1 of 1   05/17/2012     4:45:18 PM   CARE MANAGEMENT NOTE 05/17/2012  Patient:  Matthew Joseph, Matthew Joseph   Account Number:  [id number]  Date Initiated:  05/10/2012  Documentation initiated by:  Matthew Joseph  Subjective/Objective Assessment:   adm w at fib     Action/Plan:   lives w fam   Anticipated DC Date:     Anticipated DC Plan:    In-house referral  Clinical Social Worker      DC Associate Professor  CM consult  [facility]  Medication Assistance      Choice offered to / List presented to:             Status of service:  Completed, signed off Medicare Important Message given?   (If response is "NO", the following Medicare IM given date fields will be blank) Date Medicare IM given:   Date Additional Medicare IM given:    Discharge Disposition:  HOME/SELF CARE  Per UR Regulation:  Reviewed for med. necessity/level of care/duration of stay  If discussed at Long Length of Stay Meetings, dates discussed:    Comments:  05/17/12- 1600- Matthew Pierini RN, BSN [phone] Pt for discharge today, spoke with pt at bedside- pt states he lost the community card to help cover meds, new card given to pt. No other d/c needs identified.  6/10 11:20a Matthew dowell rn,bsn [phone] gave pt community card to see if it will help cover meds that are nongeneric. gave pt inform on [facility].

## 2012-05-10 NOTE — Clinical Social Work Psychosocial (Signed)
     Clinical Social Work Department BRIEF PSYCHOSOCIAL ASSESSMENT 05/10/2012  Patient:  Matthew Joseph, Matthew Joseph     Account Number:  [id number]     Admit date:  05/08/2012  Clinical Social Worker:  Hulan Fray  Date/Time:  05/10/2012 10:39 AM  Referred by:  RN  Date Referred:  05/09/2012 Referred for  Substance Abuse   Other Referral:   Interview type:  Patient Other interview type:    PSYCHOSOCIAL DATA Living Status:  ALONE Admitted from facility:   Level of care:   Primary support name:  Joni Reining Primary support relationship to patient:  FRIEND Degree of support available:   Supportrive    CURRENT CONCERNS Current Concerns  Substance Abuse   Other Concerns:    SOCIAL WORK ASSESSMENT / PLAN Clinical Social Worker received referral for substance abuse. CSW reviewed chart.Patient's girlfriend, Joni Reining was at bedside. CSW introduced self and explained reason for consult. Patient was agreeable to CSW completing SBIRT with him with visitors present. Patient scored a 7 on the SBIRT, which is low risk. Patient stated that he drinks, but he has been trying to cut back because of the effects it is having on his body. Patient stated that he has been to AA meetings in the past and that they "saved his life." Patient was interested in resources CSW offered of list of AA meetings in [place] area and substance abuse residential and outpatient treatment facilities. CSW will sign off as social work intervention is no longer needed.   Assessment/plan status:  No Further Intervention Required Other assessment/ plan:   Information/referral to community resources:   1) List of AA meetings in [place] area  2) Substance abuse treatment facilities, residential and outpatient    PATIENTS/FAMILYS RESPONSE TO PLAN OF CARE: Patient appeared to want to cut down on his drinking because of the physical effects it is having on his body. Patient was agreeable to resources provided for substance  abuse and willing to participate in AA programs again.

## 2012-05-10 NOTE — Progress Notes (Signed)
Pt c/o SOB, displaying shallow breaths, and use of accessory muscles at RR 30/min. BP of 73/53. Pt denies dizziness, pain, or n/v symptoms. MD notified and aware. Received new orders for 250 cc bolus. Will continue to monitor. Tniyah Nakagawa, Marzella Schlein

## 2012-05-10 NOTE — Significant Event (Signed)
Problems: New onset ARI, hyperkalemia, acidosis, bradycardia Discussed with bedside RN who has been in contact with Dr Algie Coffer Chart review: pt on diltiazem, carvedilol, digoxin, lisinopril  Action: Lisinopril D/C'd. Hold parameters for dilt and Coreg established. Dr Algie Coffer has ordered HCO3 ampule

## 2012-05-10 NOTE — Progress Notes (Signed)
  Echocardiogram 2D Echocardiogram has been performed.  Cathie Beams Deneen 05/10/2012, 9:07 AM

## 2012-05-10 NOTE — Progress Notes (Signed)
Subjective:  Awakens easily. Wants to go home against medical advise. Changed mind last night against leaving hospital. No fever.  Objective:  Vital Signs in the last 24 hours: Temp:  [96.9 F (36.1 C)-98.4 F (36.9 C)] 98.4 F (36.9 C) (06/10 0749) Pulse Rate:  [25-100] 100  (06/10 0454) Cardiac Rhythm:  [-] Atrial fibrillation (06/10 0981) Resp:  [21-38] 30  (06/09 2000) BP: (68-108)/(24-73) 107/71 mmHg (06/10 0635) SpO2:  [90 %-100 %] 98 % (06/10 0749) Weight:  [66.2 kg (145 lb 15.1 oz)] 66.2 kg (145 lb 15.1 oz) (06/10 0500)  Physical Exam: BP Readings from Last 1 Encounters:  05/10/12 107/71    Wt Readings from Last 1 Encounters:  05/10/12 66.2 kg (145 lb 15.1 oz)    Weight change: -0.9 kg (-1 lb 15.8 oz)  HEENT: [place]/AT, Eyes-Brown, PERL, EOMI, Conjunctiva-Pink, Sclera-Non-icteric Neck: No JVD, No bruit, Trachea midline. Lungs:  Clear, Bilateral. Cardiac:  Regular rhythm, normal S1 and S2, no S3.  Abdomen:  Soft, non-tender. Extremities:  Trace edema present. No cyanosis. No clubbing. CNS: AxOx3, Cranial nerves grossly intact, moves all 4 extremities. Right handed. Skin: Warm and dry.   Intake/Output from previous day: 06/09 0701 - 06/10 0700 In: 730 [P.O.:480; I.V.:250] Out: 1100 [Urine:1100]    Lab Results: BMET    Component Value Date/Time   NA 130* 05/10/2012 0909   K 4.9 05/10/2012 0909   CL 97 05/10/2012 0909   CO2 22 05/10/2012 0909   GLUCOSE 118* 05/10/2012 0909   BUN 25* 05/10/2012 0909   CREATININE 1.48* 05/10/2012 0909   CALCIUM 8.2* 05/10/2012 0909   GFRNONAA 61* 05/10/2012 0909   GFRAA 70* 05/10/2012 0909   CBC    Component Value Date/Time   WBC 23.5* 05/10/2012 0008   RBC 4.47 05/10/2012 0008   HGB 13.2 05/10/2012 0008   HCT 40.2 05/10/2012 0008   PLT 953* 05/10/2012 0008   MCV 89.9 05/10/2012 0008   MCH 29.5 05/10/2012 0008   MCHC 32.8 05/10/2012 0008   RDW 14.2 05/10/2012 0008   LYMPHSABS 3.1 05/08/2012 2231   MONOABS 1.5* 05/08/2012 2231   EOSABS 0.1  05/08/2012 2231   BASOSABS 0.1 05/08/2012 2231   CARDIAC ENZYMES Lab Results  Component Value Date   CKTOTAL 204 05/10/2012   CKMB 3.1 05/10/2012   TROPONINI <0.30 05/10/2012    Assessment/Plan:  Patient Active Hospital Problem List: Acute left heart systolic failure  Atrial fibrillation  H/O smoking  H/O alcohol use disorder  H/O drug use Acute renal insufficiency-Probably volume depleted Leukocytosis  IV heparin for a. Fib. IV fluids, Hold lasix, Haldol use as needed only Advised against leaving hospital prematurely.    LOS: 2 days    Orpah Cobb  MD  05/10/2012, 10:07 AM

## 2012-05-10 NOTE — Consult Note (Signed)
Pt smokes 1 ppd per month . He's currently in contemplatio stage. Discussed risk factors of even light smoking on his heart failure and a-fib and how it can exacerbate them. Pt verbalizes understanding. Encourage pt to quit. Referred to 1-800 quit now for f/u and support. Discussed oral fixation substitutes, second hand smoke and in home smoking policy. Reviewed and gave pt Written education/contact information.

## 2012-05-10 NOTE — Progress Notes (Signed)
ANTICOAGULATION CONSULT NOTE - Follow Up Consult  Pharmacy Consult for heparin Indication: atrial fibrillation  Allergies  Allergen Reactions  . Lopressor (Metoprolol Tartrate)     Patient Measurements: Height: 6\' 2"  (188 cm) Weight: 145 lb 15.1 oz (66.2 kg) IBW/kg (Calculated) : 82.2  Heparin Dosing Weight: 66.2  Vital Signs: Temp: 97.3 F (36.3 C) (06/10 1600) Temp src: Axillary (06/10 1600) BP: 99/54 mmHg (06/10 1600) Pulse Rate: 112  (06/10 1000)  Labs:  Basename 05/10/12 1656 05/10/12 0909 05/10/12 0008 05/09/12 2020 05/09/12 1700 05/09/12 0922 05/09/12 0920 05/08/12 2327 05/08/12 2231  HGB -- -- 13.2 -- -- -- 12.9* -- --  HCT -- -- 40.2 -- -- -- 38.0* -- 40.2  PLT -- -- 953* -- -- -- 1040* -- 1198*  APTT -- -- -- -- -- -- -- 35 --  LABPROT -- -- -- -- -- -- -- 17.7* --  INR -- -- -- -- -- -- -- 1.43 --  HEPARINUNFRC 0.86* -- -- -- -- -- -- -- --  CREATININE -- 1.48* 1.93* -- -- 0.84 -- -- --  CKTOTAL -- -- 204 206 215 -- -- -- --  CKMB -- -- 3.1 2.8 2.5 -- -- -- --  TROPONINI -- -- <0.30 <0.30 <0.30 -- -- -- --    Estimated Creatinine Clearance: 66.5 ml/min (by C-G formula based on Cr of 1.48).   Assessment: 31 yo M with afib, first heparin level supratherapeutic.  No overt bleeding noted per RN.  Will decrease rate.  Goal of Therapy:  Heparin level 0.3-0.7 units/ml Monitor platelets by anticoagulation protocol: Yes   Plan:  1.  Decrease heparin to 800 units/hr 2.  Check 6 hour heparin level  Rolland Porter, Pharm.D., BCPS Clinical Pharmacist Pager: [phone] 05/10/2012,6:12 PM

## 2012-05-10 NOTE — Progress Notes (Signed)
CONSULT NOTE - Initial Consult  Pharmacy Consult for heparin, vancomycin, zosyn Indication: afib, leukocytosis  Allergies  Allergen Reactions  . Lopressor (Metoprolol Tartrate)     Patient Measurements: Height: 6\' 2"  (188 cm) Weight: 145 lb 15.1 oz (66.2 kg) IBW/kg (Calculated) : 82.2  Heparin Dosing Weight: 66.2kg  Vital Signs: Temp: 98.4 F (36.9 C) (06/10 0749) Temp src: Oral (06/10 0749) BP: 107/71 mmHg (06/10 0635) Pulse Rate: 100  (06/10 0632)  Labs:  Alvira Philips 05/10/12 0909 05/10/12 0008 05/09/12 2020 05/09/12 1700 05/09/12 0922 05/09/12 0920 05/08/12 2327 05/08/12 2231  HGB -- 13.2 -- -- -- 12.9* -- --  HCT -- 40.2 -- -- -- 38.0* -- 40.2  PLT -- 953* -- -- -- 1040* -- 1198*  APTT -- -- -- -- -- -- 35 --  LABPROT -- -- -- -- -- -- 17.7* --  INR -- -- -- -- -- -- 1.43 --  HEPARINUNFRC -- -- -- -- -- -- -- --  CREATININE 1.48* 1.93* -- -- 0.84 -- -- --  CKTOTAL -- 204 206 215 -- -- -- --  CKMB -- 3.1 2.8 2.5 -- -- -- --  TROPONINI -- <0.30 <0.30 <0.30 -- -- -- --    Estimated Creatinine Clearance: 66.5 ml/min (by C-G formula based on Cr of 1.48).   Medical History: History reviewed. No pertinent past medical history.  Medications:  Prescriptions prior to admission  Medication Sig Dispense Refill  . guaiFENesin (MUCINEX) 600 MG 12 hr tablet Take 1,200 mg by mouth 2 (two) times daily.      Marland Kitchen ibuprofen (ADVIL,MOTRIN) 200 MG tablet Take 200 mg by mouth every 6 (six) hours as needed. For pain related to cough      . Multiple Vitamin (MULTIVITAMIN WITH MINERALS) TABS Take 1 tablet by mouth daily.       Scheduled:    . sodium chloride   Intravenous Once  . antiseptic oral rinse  15 mL Mouth Rinse BID  . aspirin EC  81 mg Oral Daily  . carvedilol  3.125 mg Oral BID WC  . digoxin  0.0625 mg Oral Daily  . diltiazem  60 mg Oral Q8H  . folic acid  1 mg Oral Daily  . haloperidol  1 mg Oral Once  . haloperidol  1 mg Oral Once  . LORazepam  0-4 mg Intravenous Q6H     Followed by  . LORazepam  0-4 mg Intravenous Q12H  . multivitamin with minerals  1 tablet Oral Daily  . pneumococcal 23 valent vaccine  0.5 mL Intramuscular Tomorrow-1000  . potassium chloride  20 mEq Oral Daily  . sodium bicarbonate  50 mEq Intravenous Once  . sodium bicarbonate  50 mEq Intravenous Once  . sodium chloride  250 mL Intravenous Once  . sodium chloride  3 mL Intravenous Q12H  . thiamine  100 mg Oral Daily   Or  . thiamine  100 mg Intravenous Daily  . DISCONTD: digoxin  0.125 mg Oral Daily  . DISCONTD: digoxin  0.25 mg Oral Daily  . DISCONTD: diltiazem  30 mg Oral QID  . DISCONTD: diltiazem  60 mg Oral QID  . DISCONTD: enoxaparin  40 mg Subcutaneous Q24H  . DISCONTD: furosemide  20 mg Intravenous Q12H  . DISCONTD: lisinopril  2.5 mg Oral Daily    Assessment: 31 yo male with afib to start heparin also with WBC=23.5 to start vancomycin and zosyn. SCr is 1.48 with trend down and CrCl~65. Lovenox has been ordered but  none has been administered.  Goal of Therapy:  Heparin level 0.3-0.7 units/ml Monitor platelets by anticoagulation protocol: Yes Vancomycin trough= 15-20   Plan:  -Vancomycin 750mg  IV q12hr and zosyn 3.375gm IV q8hr -Heparin 4000 unit IV bolus followed by 950 units/hr (~14 units/kg/hr) -Heparin level in 6 hours and daily wth CBC daily -Will follow cultures, renal function and vancomycin levels as needed.  Thank you for asking pharmacy to be involved in the care of this patient.  Harland German, Pharm D 05/10/2012 10:21 AM

## 2012-05-10 NOTE — Progress Notes (Signed)
CRITICAL VALUE ALERT  Critical value received:  CO2 = 10   Date of notification:  05/10/2012  Time of notification:  0030   Critical value read back:yes  Nurse who received alert:  Judeth Cornfield RN  MD notified (1st page):  Dr. Algie Coffer  Time of first page:  0110  Responding MD: Dr. Algie Coffer   Time MD responded:  Immediate response; new orders given

## 2012-05-10 NOTE — Progress Notes (Signed)
MD notified of BP ranging in 70s-80s/40s-50s after 250cc NS bolus given. Pt is sleeping, but easily arousable. AOx4, denies dizziness. States that "feels like my breathing is getting better, I'm fine". However, still appears tachypneic, with shallow breaths at RR of 30/min.  Received new orders for 250cc NS bolus, and will hold Cardizem for low BP per MD. Will continue to monitor. Spoke with [facility] RN who is following as well. Neka Bise, Marzella Schlein

## 2012-05-11 ENCOUNTER — Inpatient Hospital Stay (HOSPITAL_COMMUNITY): Payer: Self-pay

## 2012-05-11 DIAGNOSIS — D473 Essential (hemorrhagic) thrombocythemia: Secondary | ICD-10-CM

## 2012-05-11 DIAGNOSIS — I5021 Acute systolic (congestive) heart failure: Secondary | ICD-10-CM

## 2012-05-11 DIAGNOSIS — J9601 Acute respiratory failure with hypoxia: Secondary | ICD-10-CM

## 2012-05-11 DIAGNOSIS — I959 Hypotension, unspecified: Secondary | ICD-10-CM

## 2012-05-11 DIAGNOSIS — K7010 Alcoholic hepatitis without ascites: Secondary | ICD-10-CM

## 2012-05-11 DIAGNOSIS — J189 Pneumonia, unspecified organism: Secondary | ICD-10-CM

## 2012-05-11 DIAGNOSIS — G9340 Encephalopathy, unspecified: Secondary | ICD-10-CM

## 2012-05-11 DIAGNOSIS — E871 Hypo-osmolality and hyponatremia: Secondary | ICD-10-CM

## 2012-05-11 DIAGNOSIS — I429 Cardiomyopathy, unspecified: Secondary | ICD-10-CM

## 2012-05-11 DIAGNOSIS — N179 Acute kidney failure, unspecified: Secondary | ICD-10-CM

## 2012-05-11 LAB — POCT I-STAT 3, ART BLOOD GAS (G3+)
Patient temperature: 94.5
TCO2: 19 mmol/L (ref 0–100)
pCO2 arterial: 29.4 mmHg — ABNORMAL LOW (ref 35.0–45.0)
pH, Arterial: 7.377 (ref 7.350–7.450)

## 2012-05-11 LAB — CORTISOL: Cortisol, Plasma: 20 ug/dL

## 2012-05-11 LAB — COMPREHENSIVE METABOLIC PANEL
ALT: 252 U/L — ABNORMAL HIGH (ref 0–53)
Calcium: 7.9 mg/dL — ABNORMAL LOW (ref 8.4–10.5)
Creatinine, Ser: 1.79 mg/dL — ABNORMAL HIGH (ref 0.50–1.35)
GFR calc Af Amer: 56 mL/min — ABNORMAL LOW (ref >90)
Glucose, Bld: 106 mg/dL — ABNORMAL HIGH (ref 70–99)
Sodium: 128 mEq/L — ABNORMAL LOW (ref 135–145)
Total Protein: 5.9 g/dL — ABNORMAL LOW (ref 6.0–8.3)

## 2012-05-11 LAB — CBC
HCT: 41.7 % (ref 39.0–52.0)
Hemoglobin: 13.7 g/dL (ref 13.0–17.0)
MCHC: 32.9 g/dL (ref 30.0–36.0)
RBC: 4.72 MIL/uL (ref 4.22–5.81)

## 2012-05-11 LAB — LACTIC ACID, PLASMA
Lactic Acid, Venous: 5.9 mmol/L — ABNORMAL HIGH (ref 0.5–2.2)
Lactic Acid, Venous: 7.5 mmol/L — ABNORMAL HIGH (ref 0.5–2.2)
Lactic Acid, Venous: 4.2 mmol/L — ABNORMAL HIGH (ref 0.5–2.2)

## 2012-05-11 LAB — CK: Total CK: 129 U/L (ref 7–232)

## 2012-05-11 LAB — T4, FREE: Free T4: 1.05 ng/dL (ref 0.80–1.80)

## 2012-05-11 LAB — HEPARIN LEVEL (UNFRACTIONATED)
Heparin Unfractionated: 0.16 IU/mL — ABNORMAL LOW (ref 0.30–0.70)
Heparin Unfractionated: 0.18 IU/mL — ABNORMAL LOW (ref 0.30–0.70)
Heparin Unfractionated: 0.27 IU/mL — ABNORMAL LOW (ref 0.30–0.70)

## 2012-05-11 LAB — T3: T3, Total: 26.9 ng/dl — ABNORMAL LOW (ref 80.0–204.0)

## 2012-05-11 LAB — PROCALCITONIN: Procalcitonin: 5.3 ng/mL

## 2012-05-11 MED ORDER — DEXTROSE 5 % IV SOLN
500.0000 mg | Freq: Once | INTRAVENOUS | Status: AC
  Administered 2012-05-11: 500 mg via INTRAVENOUS
  Filled 2012-05-11: qty 500

## 2012-05-11 MED ORDER — HEPARIN (PORCINE) IN NACL 100-0.45 UNIT/ML-% IJ SOLN
1400.0000 [IU]/h | INTRAMUSCULAR | Status: DC
  Administered 2012-05-11 (×2): 1300 [IU]/h via INTRAVENOUS
  Filled 2012-05-11 (×2): qty 250

## 2012-05-11 MED ORDER — HEPARIN (PORCINE) IN NACL 100-0.45 UNIT/ML-% IJ SOLN
1100.0000 [IU]/h | INTRAMUSCULAR | Status: DC
  Filled 2012-05-11: qty 250

## 2012-05-11 MED ORDER — WARFARIN SODIUM 10 MG PO TABS
10.0000 mg | ORAL_TABLET | Freq: Once | ORAL | Status: AC
  Administered 2012-05-11: 10 mg via ORAL
  Filled 2012-05-11: qty 1

## 2012-05-11 MED ORDER — FOLIC ACID 1 MG PO TABS
1.0000 mg | ORAL_TABLET | Freq: Every day | ORAL | Status: DC
  Administered 2012-05-11 – 2012-05-17 (×7): 1 mg via ORAL
  Filled 2012-05-11 (×7): qty 1

## 2012-05-11 MED ORDER — HEPARIN BOLUS VIA INFUSION
2000.0000 [IU] | Freq: Once | INTRAVENOUS | Status: AC
  Administered 2012-05-11: 2000 [IU] via INTRAVENOUS
  Filled 2012-05-11: qty 2000

## 2012-05-11 MED ORDER — SODIUM BICARBONATE 8.4 % IV SOLN
INTRAVENOUS | Status: AC
  Filled 2012-05-11: qty 50

## 2012-05-11 MED ORDER — SODIUM CHLORIDE 0.9 % IJ SOLN
10.0000 mL | INTRAMUSCULAR | Status: DC | PRN
  Administered 2012-05-15 – 2012-05-16 (×3): 10 mL

## 2012-05-11 MED ORDER — SODIUM BICARBONATE 8.4 % IV SOLN
50.0000 meq | Freq: Once | INTRAVENOUS | Status: AC
  Administered 2012-05-11: 50 meq via INTRAVENOUS

## 2012-05-11 MED ORDER — LORAZEPAM 2 MG/ML IJ SOLN
1.0000 mg | INTRAMUSCULAR | Status: DC | PRN
  Administered 2012-05-13 – 2012-05-14 (×2): 1 mg via INTRAVENOUS
  Filled 2012-05-11 (×2): qty 1

## 2012-05-11 MED ORDER — AMIODARONE HCL IN DEXTROSE 360-4.14 MG/200ML-% IV SOLN
30.0000 mg/h | INTRAVENOUS | Status: DC
  Administered 2012-05-11 – 2012-05-13 (×4): 30 mg/h via INTRAVENOUS
  Filled 2012-05-11 (×11): qty 200

## 2012-05-11 MED ORDER — WARFARIN VIDEO
Freq: Once | Status: AC
  Administered 2012-05-11: 21:00:00

## 2012-05-11 MED ORDER — VITAMIN B-1 100 MG PO TABS
100.0000 mg | ORAL_TABLET | Freq: Every day | ORAL | Status: DC
  Administered 2012-05-11 – 2012-05-17 (×7): 100 mg via ORAL
  Filled 2012-05-11 (×7): qty 1

## 2012-05-11 MED ORDER — WARFARIN - PHARMACIST DOSING INPATIENT
Freq: Every day | Status: DC
  Administered 2012-05-11 – 2012-05-12 (×2)

## 2012-05-11 MED ORDER — SODIUM CHLORIDE 0.9 % IJ SOLN
10.0000 mL | Freq: Two times a day (BID) | INTRAMUSCULAR | Status: DC
  Administered 2012-05-11 – 2012-05-13 (×6): 10 mL

## 2012-05-11 MED ORDER — LACTULOSE 10 GM/15ML PO SOLN
20.0000 g | Freq: Three times a day (TID) | ORAL | Status: DC
  Administered 2012-05-11 (×2): 20 g via ORAL
  Filled 2012-05-11 (×5): qty 30

## 2012-05-11 MED ORDER — DEXTROSE 5 % IV SOLN
500.0000 mg | INTRAVENOUS | Status: DC
  Administered 2012-05-11 – 2012-05-14 (×3): 500 mg via INTRAVENOUS
  Filled 2012-05-11 (×3): qty 500

## 2012-05-11 MED ORDER — COUMADIN BOOK
Freq: Once | Status: AC
  Administered 2012-05-11: 11:00:00
  Filled 2012-05-11: qty 1

## 2012-05-11 NOTE — Progress Notes (Signed)
Subjective:  Sleepy from sedation. Off haldol. Ck-normal. TI- normal. Elevated lactic acid  Objective:  Vital Signs in the last 24 hours: Temp:  [94.5 F (34.7 C)-98.6 F (37 C)] 97.8 F (36.6 C) (06/11 0730) Pulse Rate:  [71-112] 99  (06/11 0730) Cardiac Rhythm:  [-] Atrial fibrillation (06/11 0730) Resp:  [20-32] 20  (06/11 0730) BP: (71-145)/(19-86) 99/67 mmHg (06/11 0900) SpO2:  [26 %-100 %] 98 % (06/11 0730) Weight:  [70.4 kg (155 lb 3.3 oz)] 70.4 kg (155 lb 3.3 oz) (06/11 0500)  Physical Exam: BP Readings from Last 1 Encounters:  05/11/12 99/67    Wt Readings from Last 1 Encounters:  05/11/12 70.4 kg (155 lb 3.3 oz)    Weight change: 4.2 kg (9 lb 4.2 oz)  HEENT: [place]/AT, Eyes-Brown, PERL, EOMI, Conjunctiva-Pink, Sclera-Non-icteric Neck: No JVD, No bruit, Trachea midline. Lungs:  Clear, Bilateral. Cardiac:  Irregular rhythm, normal S1 and S2, no S3. II/VI systolic murmur. Abdomen:  Soft, non-tender. Extremities:  1 + edema present. No cyanosis. No clubbing. CNS: AxOx1, Cranial nerves grossly intact, moves all 4 extremities. Right handed. Skin: Warm and dry.   Intake/Output from previous day: 06/10 0701 - 06/11 0700 In: 2553.7 [P.O.:480; I.V.:1423.7; IV Piggyback:650] Out: 50 [Urine:50]    Lab Results: BMET    Component Value Date/Time   NA 128* 05/11/2012 0054   K 5.0 05/11/2012 0054   CL 96 05/11/2012 0054   CO2 18* 05/11/2012 0054   GLUCOSE 106* 05/11/2012 0054   BUN 32* 05/11/2012 0054   CREATININE 1.79* 05/11/2012 0054   CALCIUM 7.9* 05/11/2012 0054   GFRNONAA 48* 05/11/2012 0054   GFRAA 56* 05/11/2012 0054   CBC    Component Value Date/Time   WBC 20.1* 05/11/2012 0054   RBC 4.72 05/11/2012 0054   HGB 13.7 05/11/2012 0054   HCT 41.7 05/11/2012 0054   PLT 938* 05/11/2012 0054   MCV 88.3 05/11/2012 0054   MCH 29.0 05/11/2012 0054   MCHC 32.9 05/11/2012 0054   RDW 14.0 05/11/2012 0054   LYMPHSABS 3.1 05/08/2012 2231   MONOABS 1.5* 05/08/2012 2231   EOSABS 0.1 05/08/2012  2231   BASOSABS 0.1 05/08/2012 2231   CARDIAC ENZYMES Lab Results  Component Value Date   CKTOTAL 129 05/11/2012   CKMB 3.1 05/10/2012   TROPONINI <0.30 05/10/2012    Assessment/Plan:  Patient Active Hospital Problem List: Encephalopathy acute (05/11/2012) Hold Haldol Acute respiratory failure with hypoxia (05/11/2012) Oxygen supplement Cardiomyopathy (05/11/2012) On Lanoxin and small dose B-vlocker as tolerated Systolic CHF, acute (05/11/2012) As above Hypotension (05/11/2012) As above + fluid bolus as needed Acute renal failure (05/11/2012) As above Acute alcoholic hepatitis (05/11/2012) As Above Thrombocytosis (05/11/2012) Hyponatremia (05/11/2012) Pneumonia (05/11/2012) On antibiotic  Check dig level. Small dose amiodarone, since off diltiazem Fluids as needed. PICC line.   LOS: 3 days    Orpah Cobb  MD  05/11/2012, 9:18 AM

## 2012-05-11 NOTE — Progress Notes (Signed)
WENT DOWN FOR CT SCAN OF THE HEAD AND BACK AFTER FEW MIN. WITHOUT ANY INCIDENCE.

## 2012-05-11 NOTE — Progress Notes (Signed)
ANTICOAGULATION CONSULT NOTE - Follow Up Consult  Pharmacy Consult for heparin Indication: atrial fibrillation  Labs:  Basename 05/11/12 0054 05/11/12 0052 05/10/12 1656 05/10/12 0909 05/10/12 0008 05/09/12 2020 05/09/12 1700 05/09/12 0920 05/08/12 2327  HGB 13.7 -- -- -- 13.2 -- -- -- --  HCT 41.7 -- -- -- 40.2 -- -- 38.0* --  PLT 938* -- -- -- 953* -- -- 1040* --  APTT -- -- -- -- -- -- -- -- 35  LABPROT -- -- -- -- -- -- -- -- 17.7*  INR -- -- -- -- -- -- -- -- 1.43  HEPARINUNFRC -- 0.16* 0.86* -- -- -- -- -- --  CREATININE 1.79* -- -- 1.48* 1.93* -- -- -- --  CKTOTAL -- 129 -- -- 204 206 -- -- --  CKMB -- -- -- -- 3.1 2.8 2.5 -- --  TROPONINI -- -- -- -- <0.30 <0.30 <0.30 -- --   Assessment: 31yo male now subtherapeutic on heparin after rate decrease for high level.  Goal of Therapy:  Heparin level 0.3-0.7 units/ml   Plan:  Will give heparin bolus of 2000 units and increase rate to 900 units/hr and check level in 6hr.  Colleen Can PharmD BCPS 05/11/2012,2:59 AM

## 2012-05-11 NOTE — Consult Note (Signed)
Name: Matthew Joseph MRN: [id number] DOB: Sep 22, 1981    LOS: 3  Referring Provider:  Algie Coffer Reason for Referral:  Hypotension  PULMONARY / CRITICAL CARE MEDICINE  The patient is encephalopathic and unable to provide history, which was obtained for available medical records.  HPI:  31 yo with history of alcohol abuse admitted to Cardiology service on 6/9 with leg edema and cough.  Found to be in encephalopathic, in acute renal failure, acidotic, hypotensive.  History reviewed. No pertinent past medical history. History reviewed. No pertinent past surgical history. Prior to Admission medications   Medication Sig Start Date End Date Taking? Authorizing Provider  guaiFENesin (MUCINEX) 600 MG 12 hr tablet Take 1,200 mg by mouth 2 (two) times daily.   Yes Historical Provider, MD  ibuprofen (ADVIL,MOTRIN) 200 MG tablet Take 200 mg by mouth every 6 (six) hours as needed. For pain related to cough   Yes Historical Provider, MD  Multiple Vitamin (MULTIVITAMIN WITH MINERALS) TABS Take 1 tablet by mouth daily.   Yes Historical Provider, MD   Allergies Allergies  Allergen Reactions  . Lopressor (Metoprolol Tartrate)    Family History History reviewed. No pertinent family history. Social History  reports that he has been smoking.  He does not have any smokeless tobacco history on file. He reports that he drinks alcohol. He reports that he uses illicit drugs (Cocaine and Marijuana).  Review Of Systems:  Unable to provide.  Brief patient description:   31 yo with history of alcohol abuse admitted to Cardiology service on 6/9 with leg edema and cough.  Found to be in encephalopathic, in acute renal failure, acidotic, hypotensive.  Events Since Admission: 6/9    Admitted to Cardiology service with leg edema / cough. 6/10  PCCM consulted due to hypotension   Current Status:  Vital Signs: Temp:  [94.5 F (34.7 C)-98.6 F (37 C)] 94.5 F (34.7 C) (06/10 2355) Pulse Rate:  [63-112] 71   (06/10 1800) Resp:  [30] 30  (06/10 2026) BP: (74-145)/(41-82) 79/64 mmHg (06/11 0000) SpO2:  [98 %-100 %] 98 % (06/11 0000) Weight:  [66.2 kg (145 lb 15.1 oz)] 66.2 kg (145 lb 15.1 oz) (06/10 0500)  Physical Examination: General:  Somnolent, unable to provide history, no acute distress Neuro:  Non focal HEENT:  PERRL Neck:  No JVD, lymphadenopathy Cardiovascular:  Irregular rate, no murmurs Lungs:  Bilateral diminished air entry, no w/r/r Abdomen:  Soft, nontender Musculoskeletal:  Trace pedal edema Skin:  No rash  Active Problems:  Encephalopathy acute  Systolic CHF, acute  Cardiomyopathy  Hypotension  Acute renal failure  Acute alcoholic hepatitis  Thrombocytosis  Hyponatremia  Pneumonia  Acute respiratory failure with hypoxia  ASSESSMENT AND PLAN  PULMONARY  Lab 05/09/12 1937 05/09/12 0135  PHART 7.308* 7.262*  PCO2ART 25.1* 32.2*  PO2ART 63.0* 37.0*  HCO3 12.6* 14.5*  O2SAT 90.0 63.0   Ventilator Settings:   CXR:  6/10 >>> RLL airspace disease ETT:  NA  A:  Pneumonia (CAP vs aspiration given location and encephalopathy).  Hypoxemic respiratory failure (now minimal oxygen requirements). P:   Antibiotics / cultures per ID section Supplemental oxygen No indications for mechanical support at this time  CARDIOVASCULAR  Lab 05/10/12 0008 05/09/12 2020 05/09/12 1700 05/09/12 1219 05/09/12 0921 05/09/12 0022  TROPONINI <0.30 <0.30 <0.30 <0.30 <0.30 --  LATICACIDVEN -- -- -- -- -- --  PROBNP 3541.0* -- -- -- -- 2344.0*   ECG:  6/10 >>> Atrial fibrillation, inverted T in lateral leeds  TTE:   6/10 >>> EF 35-40%, severe MR, severe TR, PA pressure 40 torr Lines: NA  A: New onset atrial fibrillation with RVR (now controlled rate).  Cardiomyopathy (likely alcoholic).  TR.  MR.  Now hypotension on the background of intravascular depletion.  Abnormal T waves but negative troponin. P:  IVF per renal section Will consider Dopamine Heparin gtt D/c Cardizem as  hypotensive Low dose Coreg with holding parameters Digoxin Lactate May need CVL  RENAL  Lab 05/10/12 0909 05/10/12 0008 05/09/12 0922 05/08/12 2231  NA 130* 131* 137 134*  K 4.9 5.6* -- --  CL 97 95* 101 101  CO2 22 10* 24 18*  BUN 25* 19 11 9   CREATININE 1.48* 1.93* 0.84 0.74  CALCIUM 8.2* 8.5 8.5 9.0  MG -- -- -- --  PHOS -- -- -- --   Intake/Output      06/10 0701 - 06/11 0700   P.O. 240   I.V. (mL/kg) 622.9 (9.4)   Total Intake(mL/kg) 862.9 (13)   Net +862.9       Urine Occurrence 400 x    Foley:  NA  A:  Acute renal failure.  Clinically intravascularly depleted. Hyponatremia.  Metabolic acidosis.  Possible rhabdomyolysis.  P:   IVF boluses Trend BMP CK / Myoglobin  GASTROINTESTINAL  Lab 05/08/12 2327  AST 233*  ALT 192*  ALKPHOS 175*  BILITOT 2.6*  PROT 7.6  ALBUMIN 3.1*    A:  Suspected acute alcoholic hepatitis vs shocked liver. P:   Trend LFTs  HEMATOLOGIC  Lab 05/10/12 0008 05/09/12 0920 05/08/12 2327 05/08/12 2231  HGB 13.2 12.9* -- 13.8  HCT 40.2 38.0* -- 40.2  PLT 953* 1040* -- 1198*  INR -- -- 1.43 --  APTT -- -- 35 --   A:  Significant thrombocytosis, etiology is not clear, possibly reactive. P:  Trend CBC Consider hematology consultation  INFECTIOUS  Lab 05/10/12 0008 05/09/12 0920 05/08/12 2231  WBC 23.5* 16.6* 13.9*  PROCALCITON -- -- --   Cultures: 6/10 Blood >>>  Antibiotics: 6/9  Vancomycin 6/9  Zosyn  A:  CAP vs aspiration pneumonia.  ? Mycoplasma  Add Azithromycin for atypical coverage PCT Urine strep / legionella Ag Cold agglutinins Mycoplasma IgM   ENDOCRINE  Lab 05/08/12 2350  GLUCAP 104*   A:  No acute issues P:   TSH, free T4, total T3 Random cortisol  NEUROLOGIC  A:  Alcohol abuse. Encephalopathy, multifactorial.  Tox screen positive for cannabinoids. P:   Ammonia level Thiamine Folate CIWA + Ativan PRN D/c Haldol  BEST PRACTICE / DISPOSITION Level of Care:  SDU [place] Service:   Cardiology Consultants:  PCCM Code Status:  Full Diet:  Heart healthy DVT Px:  Not indicated (Heparin gtt) GI Px:  Not indicated Skin Integrity:  Intact Social / Family:  Not available  The patient is critically ill with multiple organ systems failure and requires high complexity decision making for assessment and support, frequent evaluation and titration of therapies, application of advanced monitoring technologies and extensive interpretation of multiple databases. Critical Care Time devoted to patient care services described in this note is 45 minutes.   Lonia Farber, M.D. Pulmonary and Critical Care Medicine [facility] Pager: [phone]  05/11/2012, 12:20 AM

## 2012-05-11 NOTE — Progress Notes (Addendum)
ANTICOAGULATION CONSULT NOTE - Follow Up Consult  Pharmacy Consult for heparin, coumadin Indication: atrial fibrillation  Labs:  Basename 05/11/12 0930 05/11/12 0054 05/11/12 0052 05/10/12 1656 05/10/12 0909 05/10/12 0008 05/09/12 2020 05/09/12 1700 05/09/12 0920 05/08/12 2327  HGB -- 13.7 -- -- -- 13.2 -- -- -- --  HCT -- 41.7 -- -- -- 40.2 -- -- 38.0* --  PLT -- 938* -- -- -- 953* -- -- 1040* --  APTT -- -- -- -- -- -- -- -- -- 35  LABPROT -- -- -- -- -- -- -- -- -- 17.7*  INR -- -- -- -- -- -- -- -- -- 1.43  HEPARINUNFRC 0.18* -- 0.16* 0.86* -- -- -- -- -- --  CREATININE -- 1.79* -- -- 1.48* 1.93* -- -- -- --  CKTOTAL -- -- 129 -- -- 204 206 -- -- --  CKMB -- -- -- -- -- 3.1 2.8 2.5 -- --  TROPONINI -- -- -- -- -- <0.30 <0.30 <0.30 -- --   Assessment: 31yo male with afib  on heparin and level is still below goal at 0.18 (s/p bolus and rate increase). Coumadin to start today and baseline INR was 1.43 on 05/08/12.  Goal of Therapy:  Heparin level 0.3-0.7 units/ml Platelet monitoring per protocol: yes    Plan:  -Will repeat heparin bolus of 2000 units and increase rate to 1100 units/hr and check level in 6hr. -Coumadin 10mg  po x1 today -Daily PT/INR -Will begin coumadin education process  Benny Lennert PharmD BCPS 05/11/2012,10:24 AM

## 2012-05-11 NOTE — Progress Notes (Signed)
ANTICOAGULATION CONSULT NOTE - Follow Up Consult  Pharmacy Consult for heparin, coumadin Indication: atrial fibrillation  Labs:  Basename 05/11/12 1736 05/11/12 0930 05/11/12 0054 05/11/12 0052 05/10/12 0909 05/10/12 0008 05/09/12 2020 05/09/12 1700 05/09/12 0920 05/08/12 2327  HGB -- -- 13.7 -- -- 13.2 -- -- -- --  HCT -- -- 41.7 -- -- 40.2 -- -- 38.0* --  PLT -- -- 938* -- -- 953* -- -- 1040* --  APTT -- -- -- -- -- -- -- -- -- 35  LABPROT -- -- -- -- -- -- -- -- -- 17.7*  INR -- -- -- -- -- -- -- -- -- 1.43  HEPARINUNFRC 0.27* 0.18* -- 0.16* -- -- -- -- -- --  CREATININE -- -- 1.79* -- 1.48* 1.93* -- -- -- --  CKTOTAL -- -- -- 129 -- 204 206 -- -- --  CKMB -- -- -- -- -- 3.1 2.8 2.5 -- --  TROPONINI -- -- -- -- -- <0.30 <0.30 <0.30 -- --   Assessment: 31yo male with afib  on heparin and level is still below goal at 0.27 this PM  Goal of Therapy:  Heparin level 0.3-0.7 units/ml Platelet monitoring per protocol: yes    Plan:  - Increase heparin drip to 1300 units/hr - Check 8 hr heparin level  Clide Cliff PharmD BCPS 05/11/2012,6:33 PM

## 2012-05-11 NOTE — Progress Notes (Signed)
Peripherally Inserted Central Catheter/Midline Placement  The IV Nurse has discussed with the patient and/or persons authorized to consent for the patient, the purpose of this procedure and the potential benefits and risks involved with this procedure.  The benefits include less needle sticks, lab draws from the catheter and patient may be discharged home with the catheter.  Risks include, but not limited to, infection, bleeding, blood clot (thrombus formation), and puncture of an artery; nerve damage and irregular heat beat.  Alternatives to this procedure were also discussed.  PICC/Midline Placement Documentation        Matthew Joseph 05/11/2012, 11:32 AM

## 2012-05-11 NOTE — Progress Notes (Signed)
Name: Matthew Joseph MRN: [id number] DOB: [birth year]    LOS: 3  Referring Provider:  Algie Coffer Reason for Referral:  Hypotension  PULMONARY / CRITICAL CARE MEDICINE  The patient is encephalopathic and unable to provide history, which was obtained for available medical records.  HPI:  31 yo with history of alcohol abuse admitted to Cardiology service on 6/9 with leg edema and cough.  Found to be in encephalopathic, in acute renal failure, acidotic, hypotensive.  Brief patient description:   31 yo with history of alcohol abuse admitted to Cardiology service on 6/9 with leg edema and cough.  Found to be in encephalopathic, in acute renal failure, acidotic, hypotensive.  Events Since Admission: 6/9    Admitted to Cardiology service with leg edema / cough. 6/10  PCCM consulted due to hypotension  Current Status: Critical  Vital Signs: Temp:  [94.5 F (34.7 C)-98.6 F (37 C)] 97.8 F (36.6 C) (06/11 0730) Pulse Rate:  [71-112] 99  (06/11 0730) Resp:  [20-32] 20  (06/11 0730) BP: (71-145)/(19-86) 99/67 mmHg (06/11 0900) SpO2:  [26 %-100 %] 98 % (06/11 0730) Weight:  [70.4 kg (155 lb 3.3 oz)] 70.4 kg (155 lb 3.3 oz) (06/11 0500)  Intake/Output Summary (Last 24 hours) at 05/11/12 0936 Last data filed at 05/11/12 0800  Gross per 24 hour  Intake 2622.68 ml  Output     50 ml  Net 2572.68 ml   Physical Examination: General:  Somnolent, unable to provide history, no acute distress Neuro:  Non focal HEENT:  PERRL Neck:  No JVD, lymphadenopathy Cardiovascular:  Irregular rate, no murmurs Lungs:  Bilateral diminished air entry, no w/r/r Abdomen:  Soft, nontender Musculoskeletal:  Trace pedal edema Skin:  No rash  Active Problems:  Encephalopathy acute  Acute respiratory failure with hypoxia  Cardiomyopathy  Systolic CHF, acute  Hypotension  Acute renal failure  Acute alcoholic hepatitis  Thrombocytosis  Hyponatremia  Pneumonia  ASSESSMENT AND PLAN  PULMONARY  Lab  05/11/12 0712 05/09/12 1937 05/09/12 0135  PHART 7.377 7.308* 7.262*  PCO2ART 29.4* 25.1* 32.2*  PO2ART 81.0 63.0* 37.0*  HCO3 17.7* 12.6* 14.5*  O2SAT 97.0 90.0 63.0   Ventilator Settings:   CXR:  6/10 >>> RLL airspace disease likely edema. ETT:  NA  A:  Pneumonia (CAP vs aspiration given location and encephalopathy).  Hypoxemic respiratory failure (now minimal oxygen requirements). P:   Antibiotics / cultures per ID section Supplemental oxygen No indications for mechanical support at this time  CARDIOVASCULAR  Lab 05/11/12 0549 05/11/12 0053 05/10/12 0008 05/09/12 2020 05/09/12 1700 05/09/12 1219 05/09/12 0921 05/09/12 0022  TROPONINI -- -- <0.30 <0.30 <0.30 <0.30 <0.30 --  LATICACIDVEN 7.5* 5.9* -- -- -- -- -- --  PROBNP -- -- 3541.0* -- -- -- -- 2344.0*   ECG:  6/10 >>> Atrial fibrillation, inverted T in lateral leeds TTE:   6/10 >>> EF 35-40%, severe MR, severe TR, PA pressure 40 torr Lines: NA  A: New onset atrial fibrillation with RVR (now controlled rate).  Cardiomyopathy (likely alcoholic).  TR.  MR.  Now hypotension on the background of intravascular depletion.  Abnormal T waves but negative troponin. P:  IVF per renal section Will consider Dopamine Heparin gtt D/c Cardizem as hypotensive Low dose Coreg with holding parameters Digoxin Lactate May need CVL but will hold off for now.  RENAL  Lab 05/11/12 0054 05/10/12 0909 05/10/12 0008 05/09/12 0922 05/08/12 2231  NA 128* 130* 131* 137 134*  K 5.0 4.9 -- -- --  CL 96 97 95* 101 101  CO2 18* 22 10* 24 18*  BUN 32* 25* 19 11 9   CREATININE 1.79* 1.48* 1.93* 0.84 0.74  CALCIUM 7.9* 8.2* 8.5 8.5 9.0  MG -- -- -- -- --  PHOS -- -- -- -- --   Intake/Output      06/10 0701 - 06/11 0700 06/11 0701 - 06/12 0700   P.O. 480    I.V. (mL/kg) 1423.7 (20.2) 69 (1)   IV Piggyback 650    Total Intake(mL/kg) 2553.7 (36.3) 69 (1)   Urine (mL/kg/hr) 50 (0)    Total Output 50    Net +2503.7 +69        Urine  Occurrence 401 x     Foley:  NA  A:  Acute renal failure.  Clinically intravascularly depleted. Hyponatremia.  Metabolic acidosis.  Possible rhabdomyolysis.  P:   IVF boluses Trend BMP CK/Myoglobin  GASTROINTESTINAL  Lab 05/11/12 0054 05/08/12 2327  AST 378* 233*  ALT 252* 192*  ALKPHOS 124* 175*  BILITOT 2.2* 2.6*  PROT 5.9* 7.6  ALBUMIN 2.4* 3.1*    A:  Suspected acute alcoholic hepatitis vs shocked liver. P:   Trend LFTs  HEMATOLOGIC  Lab 05/11/12 0054 05/10/12 0008 05/09/12 0920 05/08/12 2327 05/08/12 2231  HGB 13.7 13.2 12.9* -- 13.8  HCT 41.7 40.2 38.0* -- 40.2  PLT 938* 953* 1040* -- 1198*  INR -- -- -- 1.43 --  APTT -- -- -- 35 --   A:  Significant thrombocytosis, etiology is not clear, possibly reactive. P:  Trend CBC Consider hematology consultation  INFECTIOUS  Lab 05/11/12 0054 05/10/12 0008 05/09/12 0920 05/08/12 2231  WBC 20.1* 23.5* 16.6* 13.9*  PROCALCITON 5.30 -- -- --   Cultures: 6/10 Blood >>>  Antibiotics: 6/9  Vancomycin>>> 6/9  Zosyn>>> 6/10 Azithro>>>  A:  CAP vs aspiration pneumonia.  ? Mycoplasma  Added Azithromycin for atypical coverage PCT 5.30 Urine strep / legionella Ag pending Cold agglutinins pending Mycoplasma IgM pending  ENDOCRINE  Lab 05/08/12 2350  GLUCAP 104*   A:  No acute issues P:   TSH, free T4, total T3 Random cortisol 20 but BP is stable.  NEUROLOGIC  A:  Alcohol abuse. Encephalopathy, multifactorial.  Tox screen positive for cannabinoids. P:   Ammonia level elevated Thiamine Folate CIWA + Ativan PRN D/c Haldol Start lactulose with f/u ammonia level  I suspect that the current deterioration is likely due to loss of atrial kick.  I would be very careful with amiodarone given liver disorder.  Lactulose for elevated ammonia level and diureses as kidneys and BP permits.  I believe that patient would benefit from evaluation in a transplant center.  Will continue abx as ordered but diureses will be  very important.  With regards to liver the patient's worsening LFTs are likely related to congestion on top of alcoholic hepatitis.  I am not sure how that is going to change his transplant status as a candidate.  Koren Bound, M.D. Pulmonary and Critical Care Medicine [facility] Pager: [phone]  05/11/2012, 9:36 AM

## 2012-05-12 ENCOUNTER — Inpatient Hospital Stay (HOSPITAL_COMMUNITY): Payer: Self-pay

## 2012-05-12 LAB — COMPREHENSIVE METABOLIC PANEL
ALT: 233 U/L — ABNORMAL HIGH (ref 0–53)
Alkaline Phosphatase: 115 U/L (ref 39–117)
BUN: 27 mg/dL — ABNORMAL HIGH (ref 6–23)
Chloride: 96 mEq/L (ref 96–112)
GFR calc Af Amer: 79 mL/min — ABNORMAL LOW (ref >90)
Glucose, Bld: 100 mg/dL — ABNORMAL HIGH (ref 70–99)
Potassium: 4 mEq/L (ref 3.5–5.1)
Total Bilirubin: 2.1 mg/dL — ABNORMAL HIGH (ref 0.3–1.2)

## 2012-05-12 LAB — DIGOXIN LEVEL: Digoxin Level: 1.5 ng/mL (ref 0.8–2.0)

## 2012-05-12 LAB — CBC
HCT: 37 % — ABNORMAL LOW (ref 39.0–52.0)
Hemoglobin: 12.5 g/dL — ABNORMAL LOW (ref 13.0–17.0)
MCV: 86.2 fL (ref 78.0–100.0)
RBC: 4.29 MIL/uL (ref 4.22–5.81)
WBC: 16 10*3/uL — ABNORMAL HIGH (ref 4.0–10.5)

## 2012-05-12 LAB — STREP PNEUMONIAE URINARY ANTIGEN: Strep Pneumo Urinary Antigen: NEGATIVE

## 2012-05-12 LAB — HEPARIN LEVEL (UNFRACTIONATED): Heparin Unfractionated: 0.56 IU/mL (ref 0.30–0.70)

## 2012-05-12 MED ORDER — VANCOMYCIN HCL 1000 MG IV SOLR
750.0000 mg | Freq: Three times a day (TID) | INTRAVENOUS | Status: DC
  Administered 2012-05-12 – 2012-05-14 (×6): 750 mg via INTRAVENOUS
  Filled 2012-05-12 (×8): qty 750

## 2012-05-12 MED ORDER — GUAIFENESIN 100 MG/5ML PO SOLN
15.0000 mL | ORAL | Status: DC | PRN
  Administered 2012-05-13 – 2012-05-14 (×2): 300 mg via ORAL
  Filled 2012-05-12 (×3): qty 15

## 2012-05-12 MED ORDER — HEPARIN (PORCINE) IN NACL 100-0.45 UNIT/ML-% IJ SOLN
1400.0000 [IU]/h | INTRAMUSCULAR | Status: DC
  Administered 2012-05-12 – 2012-05-13 (×2): 1400 [IU]/h via INTRAVENOUS
  Filled 2012-05-12 (×3): qty 250

## 2012-05-12 MED ORDER — WARFARIN SODIUM 2.5 MG PO TABS
2.5000 mg | ORAL_TABLET | Freq: Once | ORAL | Status: AC
  Administered 2012-05-12: 2.5 mg via ORAL
  Filled 2012-05-12: qty 1

## 2012-05-12 MED ORDER — FUROSEMIDE 10 MG/ML IJ SOLN
20.0000 mg | Freq: Once | INTRAMUSCULAR | Status: AC
  Administered 2012-05-12: 20 mg via INTRAVENOUS
  Filled 2012-05-12: qty 2

## 2012-05-12 NOTE — Progress Notes (Signed)
ANTICOAGULATION CONSULT NOTE - Follow Up Consult  Pharmacy Consult for heparin Indication: atrial fibrillation  Labs:  Basename 05/12/12 0500 05/11/12 1736 05/11/12 0930 05/11/12 0054 05/11/12 0052 05/10/12 0909 05/10/12 0008 05/09/12 2020 05/09/12 1700  HGB 12.5* -- -- 13.7 -- -- -- -- --  HCT 37.0* -- -- 41.7 -- -- 40.2 -- --  PLT 886* -- -- 938* -- -- 953* -- --  APTT -- -- -- -- -- -- -- -- --  LABPROT 22.8* -- -- -- -- -- -- -- --  INR 1.97* -- -- -- -- -- -- -- --  HEPARINUNFRC 0.30 0.27* 0.18* -- -- -- -- -- --  CREATININE -- -- -- 1.79* -- 1.48* 1.93* -- --  CKTOTAL -- -- -- -- 129 -- 204 206 --  CKMB -- -- -- -- -- -- 3.1 2.8 2.5  TROPONINI -- -- -- -- -- -- <0.30 <0.30 <0.30    Assessment: 31yo male now therapeutic on heparin but at very low end of goal and very little increase in level since increase in rate.  Goal of Therapy:  Heparin level 0.3-0.7 units/ml   Plan:  Will increase heparin to 1400 units/hr to ensure does not drop below goal and confirm this afternoon with another level.  Colleen Can PharmD BCPS 05/12/2012,6:53 AM

## 2012-05-12 NOTE — Progress Notes (Signed)
Subjective:  Diarrhea from lactulose. No new complaint. Awake today. Afebrile.  Objective:  Vital Signs in the last 24 hours: Temp:  [97.2 F (36.2 C)-97.5 F (36.4 C)] 97.4 F (36.3 C) (06/12 0355) Pulse Rate:  [84-98] 98  (06/11 1647) Cardiac Rhythm:  [-] Atrial fibrillation (06/12 0600) Resp:  [18-20] 18  (06/12 0600) BP: (91-110)/(23-79) 108/68 mmHg (06/12 0600) SpO2:  [84 %-100 %] 93 % (06/12 0600) Weight:  [72.8 kg (160 lb 7.9 oz)] 72.8 kg (160 lb 7.9 oz) (06/12 0500)  Physical Exam: BP Readings from Last 1 Encounters:  05/12/12 108/68    Wt Readings from Last 1 Encounters:  05/12/12 72.8 kg (160 lb 7.9 oz)    Weight change: 2.4 kg (5 lb 4.7 oz)  HEENT: [place]/AT, Eyes-Brown, PERL, EOMI, Conjunctiva-Pink, Sclera-Non-icteric Neck: No JVD, No bruit, Trachea midline. Lungs:  Clear, Bilateral. Cardiac:  Iregular rhythm, normal S1 and S2, no S3. II/VI systolic murmur.  Abdomen:  Soft, non-tender. Extremities:  Trace edema present. No cyanosis. No clubbing. CNS: AxOx2, Cranial nerves grossly intact, moves all 4 extremities. Right handed. Skin: Warm and dry.   Intake/Output from previous day: 06/11 0701 - 06/12 0700 In: 3757.7 [P.O.:540; I.V.:2542.7; IV Piggyback:675] Out: 100 [Urine:100]    Lab Results: BMET    Component Value Date/Time   NA 128* 05/12/2012 0500   K 4.0 05/12/2012 0500   CL 96 05/12/2012 0500   CO2 21 05/12/2012 0500   GLUCOSE 100* 05/12/2012 0500   BUN 27* 05/12/2012 0500   CREATININE 1.35 05/12/2012 0500   CALCIUM 8.0* 05/12/2012 0500   GFRNONAA 68* 05/12/2012 0500   GFRAA 79* 05/12/2012 0500   CBC    Component Value Date/Time   WBC 16.0* 05/12/2012 0500   RBC 4.29 05/12/2012 0500   HGB 12.5* 05/12/2012 0500   HCT 37.0* 05/12/2012 0500   PLT 886* 05/12/2012 0500   MCV 86.2 05/12/2012 0500   MCH 29.1 05/12/2012 0500   MCHC 33.8 05/12/2012 0500   RDW 14.0 05/12/2012 0500   LYMPHSABS 3.1 05/08/2012 2231   MONOABS 1.5* 05/08/2012 2231   EOSABS 0.1 05/08/2012 2231    BASOSABS 0.1 05/08/2012 2231   CARDIAC ENZYMES Lab Results  Component Value Date   CKTOTAL 129 05/11/2012   CKMB 3.1 05/10/2012   TROPONINI <0.30 05/10/2012    Assessment/Plan:  Patient Active Hospital Problem List: Encephalopathy acute (05/11/2012) Improving Acute respiratory failure with hypoxia (05/11/2012) Improving Cardiomyopathy (05/11/2012) Stable Systolic CHF, acute (05/11/2012) Stable Hypotension (05/11/2012) Stable Acute renal failure (05/11/2012) Improving Acute alcoholic hepatitis (05/11/2012) Improving Thrombocytosis (05/11/2012) Improving Hyponatremia (05/11/2012) Persist Pneumonia (05/11/2012) Stable with no cough or fever.  DC IV heparin and PO aspirin Increase activity   LOS: 4 days    Orpah Cobb  MD  05/12/2012, 7:55 AM

## 2012-05-12 NOTE — Progress Notes (Addendum)
CONSULT NOTE - Follow Up Consult  Pharmacy Consult for heparin, vancomycin Indication: atrial fibrillation  Labs:  Basename 05/12/12 0500 05/11/12 1736 05/11/12 0930 05/11/12 0054 05/11/12 0052 05/10/12 0909 05/10/12 0008 05/09/12 2020 05/09/12 1700  HGB 12.5* -- -- 13.7 -- -- -- -- --  HCT 37.0* -- -- 41.7 -- -- 40.2 -- --  PLT 886* -- -- 938* -- -- 953* -- --  APTT -- -- -- -- -- -- -- -- --  LABPROT 22.8* -- -- -- -- -- -- -- --  INR 1.97* -- -- -- -- -- -- -- --  HEPARINUNFRC 0.30 0.27* 0.18* -- -- -- -- -- --  CREATININE 1.35 -- -- 1.79* -- 1.48* -- -- --  CKTOTAL -- -- -- -- 129 -- 204 206 --  CKMB -- -- -- -- -- -- 3.1 2.8 2.5  TROPONINI -- -- -- -- -- -- <0.30 <0.30 <0.30    Assessment: 31yo male with afib on coumadin and INR up to 1.97 (baseline=1.43) after one dose of coumadin.  Patient noted with shock liver (AST/ALT 261/231, t. Bili= 2.1) and sensitivity to coumadin increased.  Spoke with Dr. Algie Coffer and will resume heparin since this in only day 2 coumadin.  Patient also on vancomycin and azithromycin for CAP vs aspiration pneumonia.  SCr=1.35 with trend down.  Cultures:  6/10 Blood >>> NGTD Antibiotics:  6/9 Vancomycin  6/9 Zosyn.> 6/11 Azith >  Goal of Therapy:  Heparin level 0.3-0.7 units/ml INR= 2-3 Platelet monitoring per protocol: yes  Plan:  -Coumadin 2.5mg  today -Resume heparin at 1400 units/hr and re-check a level in 6hrs -PT/INR, CBC and heparin levels daily -Change vancomycin to 750mg  IV q8hr due to improving renal function -Could consider d/c vancomycin?  Benny Lennert PharmD BCPS 05/12/2012,9:59 AM

## 2012-05-12 NOTE — Progress Notes (Signed)
Name: Matthew Joseph MRN: [id number] DOB: 1981/11/17    LOS: 4  Referring Provider:  Algie Coffer Reason for Referral:  Hypotension  PULMONARY / CRITICAL CARE MEDICINE  The patient is encephalopathic and unable to provide history, which was obtained for available medical records.  HPI:  31 yo with history of alcohol abuse admitted to Cardiology service on 6/9 with leg edema and cough.  Found to be in encephalopathic, in acute renal failure, acidotic, hypotensive.  Brief patient description:   31 yo with history of alcohol abuse admitted to Cardiology service on 6/9 with leg edema and cough.  Found to be in encephalopathic, in acute renal failure, acidotic, hypotensive.  Events Since Admission: 6/9    Admitted to Cardiology service with leg edema / cough. 6/10  PCCM consulted due to hypotension  Current Status: Critical  Vital Signs: Temp:  [97.2 F (36.2 C)-98.4 F (36.9 C)] 98.4 F (36.9 C) (06/12 0800) Pulse Rate:  [84-98] 88  (06/12 0800) Resp:  [18-20] 19  (06/12 0800) BP: (91-110)/(23-79) 96/71 mmHg (06/12 0800) SpO2:  [84 %-100 %] 95 % (06/12 0800) Weight:  [72.8 kg (160 lb 7.9 oz)] 72.8 kg (160 lb 7.9 oz) (06/12 0500)  Intake/Output Summary (Last 24 hours) at 05/12/12 1004 Last data filed at 05/12/12 0915  Gross per 24 hour  Intake 3724.9 ml  Output    100 ml  Net 3624.9 ml   Physical Examination: General:  Somnolent, unable to provide history, no acute distress Neuro:  Non focal HEENT:  PERRL Neck:  No JVD, lymphadenopathy Cardiovascular:  Irregular rate, no murmurs Lungs:  Bilateral diminished air entry, no w/r/r Abdomen:  Soft, nontender Musculoskeletal:  Trace pedal edema Skin:  No rash  Active Problems:  Encephalopathy acute  Acute respiratory failure with hypoxia  Cardiomyopathy  Systolic CHF, acute  Hypotension  Acute renal failure  Acute alcoholic hepatitis  Thrombocytosis  Hyponatremia  Pneumonia  ASSESSMENT AND PLAN  PULMONARY  Lab  05/11/12 0712 05/09/12 1937 05/09/12 0135  PHART 7.377 7.308* 7.262*  PCO2ART 29.4* 25.1* 32.2*  PO2ART 81.0 63.0* 37.0*  HCO3 17.7* 12.6* 14.5*  O2SAT 97.0 90.0 63.0   Ventilator Settings:   CXR:  6/10 >>> RLL airspace disease likely edema. ETT:  NA  A:  Pneumonia (CAP vs aspiration given location and encephalopathy).  Hypoxemic respiratory failure (now minimal oxygen requirements).  Improving. P:   Antibiotics / cultures per ID section. Supplemental oxygen. IS and flutter valve.  CARDIOVASCULAR  Lab 05/11/12 0930 05/11/12 0549 05/11/12 0053 05/10/12 0008 05/09/12 2020 05/09/12 1700 05/09/12 1219 05/09/12 0921 05/09/12 0022  TROPONINI -- -- -- <0.30 <0.30 <0.30 <0.30 <0.30 --  LATICACIDVEN 4.2* 7.5* 5.9* -- -- -- -- -- --  PROBNP -- -- -- 3541.0* -- -- -- -- 2344.0*   ECG:  6/10 >>> Atrial fibrillation, inverted T in lateral leeds TTE:   6/10 >>> EF 35-40%, severe MR, severe TR, PA pressure 40 torr Lines: NA  A: New onset atrial fibrillation with RVR (now controlled rate).  Cardiomyopathy (likely alcoholic).  TR.  MR.  Now hypotension on the background of intravascular depletion.  Abnormal T waves but negative troponin. P:  IVF per renal section. Will consider Dopamine if BP drops again. Heparin gtt per cards. D/c Cardizem as hypotensive. Low dose Coreg with holding parameters. Digoxin. Lactate elevated with renal and liver dysfunction. PICC line.  RENAL  Lab 05/12/12 0500 05/11/12 0054 05/10/12 0909 05/10/12 0008 05/09/12 0922  NA 128* 128* 130* 131*  137  K 4.0 5.0 -- -- --  CL 96 96 97 95* 101  CO2 21 18* 22 10* 24  BUN 27* 32* 25* 19 11  CREATININE 1.35 1.79* 1.48* 1.93* 0.84  CALCIUM 8.0* 7.9* 8.2* 8.5 8.5  MG -- -- -- -- --  PHOS -- -- -- -- --   Intake/Output      06/11 0701 - 06/12 0700 06/12 0701 - 06/13 0700   P.O. 540    I.V. (mL/kg) 2542.7 (34.9) 321.1 (4.4)   IV Piggyback 675 37.5   Total Intake(mL/kg) 3757.7 (51.6) 358.6 (4.9)   Urine  (mL/kg/hr) 100 (0.1)    Total Output 100    Net +3657.7 +358.6        Urine Occurrence 1 x    Stool Occurrence 4 x 1 x    Foley:  NA  A:  Acute renal failure.  Clinically intravascularly depleted. Hyponatremia.  Metabolic acidosis.  Possible rhabdomyolysis.  P:   IVF boluses per renal. Trend BMP.  GASTROINTESTINAL Lab 05/12/12 0500 05/11/12 0054 05/08/12 2327  AST 261* 378* 233*  ALT 233* 252* 192*  ALKPHOS 115 124* 175*  BILITOT 2.1* 2.2* 2.6*  PROT 5.8* 5.9* 7.6  ALBUMIN 2.4* 2.4* 3.1*    A:  Suspected acute alcoholic hepatitis vs shocked liver. P:   Trend LFTs, improving. Ammonia level improving with lactulose and so is mental status.  HEMATOLOGIC  Lab 05/12/12 0500 05/11/12 0054 05/10/12 0008 05/09/12 0920 05/08/12 2327 05/08/12 2231  HGB 12.5* 13.7 13.2 12.9* -- 13.8  HCT 37.0* 41.7 40.2 38.0* -- 40.2  PLT 886* 938* 953* 1040* -- 1198*  INR 1.97* -- -- -- 1.43 --  APTT -- -- -- -- 35 --   A:  Significant thrombocytosis, etiology is not clear, possibly reactive. P:  Trend CBC  INFECTIOUS  Lab 05/12/12 0500 05/11/12 0054 05/10/12 0008 05/09/12 0920 05/08/12 2231  WBC 16.0* 20.1* 23.5* 16.6* 13.9*  PROCALCITON -- 5.30 -- -- --   Cultures: 6/10 Blood >>>NTD  Antibiotics: 6/9  Vancomycin>>> 6/9  Zosyn>>> 6/10 Azithro>>>  A:  CAP vs aspiration pneumonia.  ? Mycoplasma  Finish 5 day course of azithro PCT 5.30 Cold agglutinins pending Mycoplasma IgM pending  ENDOCRINE  Lab 05/08/12 2350  GLUCAP 104*   A:  No acute issues P:   TSH, free T4, total T3 Random cortisol 20 but BP is stable.  NEUROLOGIC  A:  Alcohol abuse. Encephalopathy, multifactorial.  Tox screen positive for cannabinoids. P:   Ammonia level elevated, responded to lactulose, will maintain current dose. Thiamine Folate CIWA + Ativan PRN D/c Haldol  Patient seemed to stabilize from a mental status standpoint with lower ammonia with lactulose but will defer to primary at this  point.  Recommend continue diureses and evaluation for transplant.  PCCM signing off, please call back if needed.  Koren Bound, M.D. Pulmonary and Critical Care Medicine [facility] Pager: [phone]  05/12/2012, 10:04 AM

## 2012-05-12 NOTE — Progress Notes (Signed)
CONSULT NOTE - Follow Up Consult  Pharmacy Consult for heparin, vancomycin Indication: atrial fibrillation  Labs:  Basename 05/12/12 1726 05/12/12 0500 05/11/12 1736 05/11/12 0054 05/11/12 0052 05/10/12 0909 05/10/12 0008 05/09/12 2020  HGB -- 12.5* -- 13.7 -- -- -- --  HCT -- 37.0* -- 41.7 -- -- 40.2 --  PLT -- 886* -- 938* -- -- 953* --  LABPROT -- 22.8* -- -- -- -- -- --  INR -- 1.97* -- -- -- -- -- --  HEPARINUNFRC 0.56 0.30 0.27* -- -- -- -- --  CREATININE -- 1.35 -- 1.79* -- 1.48* -- --  CKTOTAL -- -- -- -- 129 -- 204 206    Assessment: 31yo male with afib on coumadin and INR up to 1.97 (baseline=1.43) after one dose of coumadin.  Patient noted with shock liver (AST/ALT 261/231, t. Bili= 2.1) and sensitivity to coumadin increased.  Heparin level this afternoon = 0.56 IU/ml and is within desired goal range.  No bleeding complications with therapy noted.  Goal of Therapy:  Heparin level 0.3-0.7 units/ml INR= 2-3 Platelet monitoring per protocol: yes  Plan:  -Will continue IV Heparin at 1400 units/hr and re-check level in AM -PT/INR, CBC and heparin levels daily   Nadara Mustard, PharmD., MS Clinical Pharmacist Pager:  [phone]  Thank you for allowing pharmacy to be part of this patients care team. 05/12/2012,6:20 PM

## 2012-05-12 NOTE — Progress Notes (Signed)
CONVERTED TO NSR, CONFIRMED BY EKG. DR [facility] MADE AWARE.

## 2012-05-12 NOTE — Progress Notes (Signed)
WITH INTERMITTENT PRODUCTIVE COUGH, LUNGS WITH FEW CRACKLES AT THE BASE. Marland Kitchen PORTABLE CHEST X-RAY DONE RESULT SEEN BY MD, WITH ORDER.

## 2012-05-13 LAB — PROTIME-INR: INR: 2.87 — ABNORMAL HIGH (ref 0.00–1.49)

## 2012-05-13 LAB — HEPARIN LEVEL (UNFRACTIONATED): Heparin Unfractionated: 0.75 IU/mL — ABNORMAL HIGH (ref 0.30–0.70)

## 2012-05-13 LAB — BASIC METABOLIC PANEL
CO2: 21 mEq/L (ref 19–32)
Chloride: 93 mEq/L — ABNORMAL LOW (ref 96–112)
Creatinine, Ser: 1.41 mg/dL — ABNORMAL HIGH (ref 0.50–1.35)
Potassium: 4.3 mEq/L (ref 3.5–5.1)

## 2012-05-13 LAB — CBC
HCT: 37.7 % — ABNORMAL LOW (ref 39.0–52.0)
Hemoglobin: 12.7 g/dL — ABNORMAL LOW (ref 13.0–17.0)
MCH: 29 pg (ref 26.0–34.0)
MCHC: 33.7 g/dL (ref 30.0–36.0)
RBC: 4.38 MIL/uL (ref 4.22–5.81)

## 2012-05-13 LAB — LEGIONELLA ANTIGEN, URINE

## 2012-05-13 MED ORDER — WARFARIN SODIUM 1 MG PO TABS
1.0000 mg | ORAL_TABLET | Freq: Once | ORAL | Status: AC
  Administered 2012-05-13: 1 mg via ORAL
  Filled 2012-05-13: qty 1

## 2012-05-13 MED ORDER — AMIODARONE HCL 200 MG PO TABS
200.0000 mg | ORAL_TABLET | Freq: Two times a day (BID) | ORAL | Status: DC
  Administered 2012-05-13 – 2012-05-17 (×9): 200 mg via ORAL
  Filled 2012-05-13 (×10): qty 1

## 2012-05-13 NOTE — Plan of Care (Signed)
Problem: Phase II Progression Outcomes Goal: Anticoagulation Therapy per MD order Outcome: Completed/Met Date Met:  05/13/12 On Coumadin Goal: CV Risk Factors identified Outcome: Completed/Met Date Met:  05/13/12 Smoker, ETOH abuse, prior substance abuse

## 2012-05-13 NOTE — Progress Notes (Signed)
Subjective:  Feeling better. Eating better. Now in Sinus rhythm. Afebrile.  Objective:  Vital Signs in the last 24 hours: Temp:  [97.3 F (36.3 C)-98.3 F (36.8 C)] 97.8 F (36.6 C) (06/13 0820) Pulse Rate:  [72-95] 72  (06/13 0820) Cardiac Rhythm:  [-] Normal sinus rhythm (06/13 0515) Resp:  [16-20] 20  (06/13 0400) BP: (92-105)/(63-73) 97/63 mmHg (06/13 0820) SpO2:  [94 %-100 %] 99 % (06/13 0820) Weight:  [73.6 kg (162 lb 4.1 oz)] 73.6 kg (162 lb 4.1 oz) (06/13 0500)  Physical Exam: BP Readings from Last 1 Encounters:  05/13/12 97/63    Wt Readings from Last 1 Encounters:  05/13/12 73.6 kg (162 lb 4.1 oz)    Weight change: 0.8 kg (1 lb 12.2 oz)  HEENT: [place]/AT, Eyes-Brown, PERL, EOMI, Conjunctiva-Pink, Sclera-Non-icteric Neck: No JVD, No bruit, Trachea midline. Lungs:  Clear, Bilateral. Cardiac:  Regular rhythm, normal S1 and S2, no S3.  Abdomen:  Soft, non-tender. Extremities:  Trace edema present. No cyanosis. No clubbing. CNS: AxOx3, Cranial nerves grossly intact, moves all 4 extremities. Right handed. Skin: Warm and dry.   Intake/Output from previous day: 06/12 0701 - 06/13 0700 In: 4053.3 [P.O.:440; I.V.:2748.8; IV Piggyback:864.5] Out: 2225 [Urine:2225]    Lab Results: BMET    Component Value Date/Time   NA 128* 05/13/2012 0545   K 4.3 05/13/2012 0545   CL 93* 05/13/2012 0545   CO2 21 05/13/2012 0545   GLUCOSE 99 05/13/2012 0545   BUN 22 05/13/2012 0545   CREATININE 1.41* 05/13/2012 0545   CALCIUM 8.1* 05/13/2012 0545   GFRNONAA 64* 05/13/2012 0545   GFRAA 75* 05/13/2012 0545   CBC    Component Value Date/Time   WBC 16.9* 05/13/2012 0545   RBC 4.38 05/13/2012 0545   HGB 12.7* 05/13/2012 0545   HCT 37.7* 05/13/2012 0545   PLT 886* 05/13/2012 0545   MCV 86.1 05/13/2012 0545   MCH 29.0 05/13/2012 0545   MCHC 33.7 05/13/2012 0545   RDW 14.1 05/13/2012 0545   LYMPHSABS 3.1 05/08/2012 2231   MONOABS 1.5* 05/08/2012 2231   EOSABS 0.1 05/08/2012 2231   BASOSABS 0.1 05/08/2012  2231   CARDIAC ENZYMES Lab Results  Component Value Date   CKTOTAL 129 05/11/2012   CKMB 3.1 05/10/2012   TROPONINI <0.30 05/10/2012    Assessment/Plan:  Patient Active Hospital Problem List: Encephalopathy acute (05/11/2012) Improving Acute respiratory failure with hypoxia (05/11/2012) Improving Cardiomyopathy (05/11/2012) Stable Systolic CHF, acute (05/11/2012) Stable  Hypotension (05/11/2012) Stable Acute renal failure (05/11/2012) Improving Acute alcoholic hepatitis (05/11/2012) Improving Thrombocytosis (05/11/2012) Persist Hyponatremia (05/11/2012) Persist Pneumonia (05/11/2012) Stable with cough eith no fever. Atrial fibrillation, paroxysmal  Increase ambulation   LOS: 5 days    Orpah Cobb  MD  05/13/2012, 8:54 AM

## 2012-05-13 NOTE — Progress Notes (Signed)
CONSULT NOTE - Follow Up Consult  Pharmacy Consult for coumadin, vancomycin Indication: atrial fibrillation  Labs:  Basename 05/13/12 0545 05/12/12 1726 05/12/12 0500 05/11/12 0054 05/11/12 0052  HGB 12.7* -- 12.5* -- --  HCT 37.7* -- 37.0* 41.7 --  PLT 886* -- 886* 938* --  APTT -- -- -- -- --  LABPROT 30.5* -- 22.8* -- --  INR 2.87* -- 1.97* -- --  HEPARINUNFRC 0.75* 0.56 0.30 -- --  CREATININE 1.41* -- 1.35 1.79* --  CKTOTAL -- -- -- -- 129  CKMB -- -- -- -- --  TROPONINI -- -- -- -- --    Assessment: 31yo male with afib (now in NSR) on coumadin and INR up to 2.87 (baseline=1.43) on D3 of coumadin.  Patient noted with shock liver (AST/ALT 261/231, t. Bili= 2.1 on 6/12) and sensitivity to coumadin increased.  Heparin d/c per MD.  Patient also on vancomycin and azithromycin for CAP vs aspiration pneumonia.  SCr=1.41 with little change compated to 6/12.  Cultures:  6/10 Blood >>> NGTD Antibiotics:  6/9 Vancomycin  6/9 Zosyn.> 6/11 Azith >  Goal of Therapy:  Heparin level 0.3-0.7 units/ml INR= 2-3 Platelet monitoring per protocol: yes  Plan:  -Coumadin 1 mg today(due to degree of INR change) -PT/INR, CBC daily -Could consider d/c vancomycin?  Benny Lennert PharmD BCPS 05/13/2012,9:22 AM

## 2012-05-14 LAB — BASIC METABOLIC PANEL
Calcium: 8.2 mg/dL — ABNORMAL LOW (ref 8.4–10.5)
GFR calc Af Amer: 81 mL/min — ABNORMAL LOW (ref >90)
GFR calc non Af Amer: 70 mL/min — ABNORMAL LOW (ref >90)
Glucose, Bld: 101 mg/dL — ABNORMAL HIGH (ref 70–99)
Potassium: 4 mEq/L (ref 3.5–5.1)
Sodium: 130 mEq/L — ABNORMAL LOW (ref 135–145)

## 2012-05-14 LAB — PROTIME-INR
INR: 2.37 — ABNORMAL HIGH (ref 0.00–1.49)
Prothrombin Time: 26.3 seconds — ABNORMAL HIGH (ref 11.6–15.2)

## 2012-05-14 LAB — CBC
Platelets: 798 10*3/uL — ABNORMAL HIGH (ref 150–400)
RBC: 4 MIL/uL — ABNORMAL LOW (ref 4.22–5.81)
RDW: 14 % (ref 11.5–15.5)
WBC: 13 10*3/uL — ABNORMAL HIGH (ref 4.0–10.5)

## 2012-05-14 LAB — PROCALCITONIN: Procalcitonin: 1.39 ng/mL

## 2012-05-14 LAB — LACTIC ACID, PLASMA: Lactic Acid, Venous: 1.2 mmol/L (ref 0.5–2.2)

## 2012-05-14 LAB — PRO B NATRIURETIC PEPTIDE: Pro B Natriuretic peptide (BNP): 783.7 pg/mL — ABNORMAL HIGH (ref 0–125)

## 2012-05-14 MED ORDER — WARFARIN SODIUM 4 MG PO TABS
4.0000 mg | ORAL_TABLET | Freq: Once | ORAL | Status: AC
  Administered 2012-05-14: 4 mg via ORAL
  Filled 2012-05-14: qty 1

## 2012-05-14 MED ORDER — AZITHROMYCIN 500 MG PO TABS
500.0000 mg | ORAL_TABLET | Freq: Every day | ORAL | Status: DC
  Administered 2012-05-14 – 2012-05-16 (×3): 500 mg via ORAL
  Filled 2012-05-14 (×5): qty 1

## 2012-05-14 NOTE — Progress Notes (Signed)
CONSULT NOTE - Follow Up Consult  Pharmacy Consult for coumadin, vancomycin, zosyn Indication: atrial fibrillation  Labs:  Basename 05/14/12 0500 05/13/12 0545 05/12/12 1726 05/12/12 0500  HGB 11.6* 12.7* -- --  HCT 33.7* 37.7* -- 37.0*  PLT 798* 886* -- 886*  APTT -- -- -- --  LABPROT 26.3* 30.5* -- 22.8*  INR 2.37* 2.87* -- 1.97*  HEPARINUNFRC -- 0.75* 0.56 0.30  CREATININE 1.32 1.41* -- 1.35  CKTOTAL -- -- -- --  CKMB -- -- -- --  TROPONINI -- -- -- --    Assessment: 31yo male with afib (now in NSR) new start on coumadin.INR 2.37 today after 10mg , 2.5 mg and 1 mg doses of coumadin. Baseline INR =1.43.  Patient noted with shock liver (AST/ALT 261/231, t. Bili= 2.1 on 6/12) and sensitivity to coumadin increased. On amiodarone 200 mg po bid which can increase INR.  Hbg down to 11.6 from 12.7 today. No bleeding reported.   Patient also on vancomycin, zosyn and azithromycin Day # 4 for CAP vs aspiration pneumonia.  Vanc stopped by MD this am.  Cultures:  6/10 Blood >>> NGTD Antibiotics:  6/9 Vancomycin > 6/14 6/9 Zosyn.> 6/11 Azith >  Goal of Therapy:   INR= 2-3 Plate  Plan:  1. Coumadin 4 mg po x 1 dose today 2. Vanc stopped by MD 3. Change azithromycin to PO, day # 4 4. Zosyn 3.375 gm q8h over 4 hrs day # 4 Herby Abraham, Pharm.D. [phone] 05/14/2012 9:18 AM

## 2012-05-14 NOTE — Progress Notes (Signed)
Subjective:  Further small improvement in lab and activity. Remains in sinus rhythm.  Objective:  Vital Signs in the last 24 hours: Temp:  [97.6 F (36.4 C)-98.9 F (37.2 C)] 98.1 F (36.7 C) (06/14 0400) Pulse Rate:  [71-72] 71  (06/13 1134) Cardiac Rhythm:  [-] Normal sinus rhythm (06/14 0400) Resp:  [10-23] 18  (06/14 0400) BP: (91-103)/(54-70) 103/59 mmHg (06/14 0400) SpO2:  [96 %-99 %] 96 % (06/14 0400) Weight:  [73.5 kg (162 lb 0.6 oz)] 73.5 kg (162 lb 0.6 oz) (06/14 0500)  Physical Exam: BP Readings from Last 1 Encounters:  05/14/12 103/59    Wt Readings from Last 1 Encounters:  05/14/12 73.5 kg (162 lb 0.6 oz)    Weight change: -0.1 kg (-3.5 oz)  HEENT: [place]/AT, Eyes-Brown, PERL, EOMI, Conjunctiva-Pink, Sclera-Non-icteric Neck: No JVD, No bruit, Trachea midline. Lungs:  Clear, Bilateral. Cardiac:  Regular rhythm, normal S1 and S2, no S3.  Abdomen:  Soft, non-tender. Extremities:  No edema present. No cyanosis. No clubbing. CNS: AxOx3, Cranial nerves grossly intact, moves all 4 extremities. Right handed. Skin: Warm and dry.   Intake/Output from previous day: 06/13 0701 - 06/14 0700 In: 2868.9 [P.O.:590; I.V.:1297.4; IV Piggyback:861.5] Out: 2000 [Urine:2000]    Lab Results: BMET    Component Value Date/Time   NA 130* 05/14/2012 0500   K 4.0 05/14/2012 0500   CL 96 05/14/2012 0500   CO2 24 05/14/2012 0500   GLUCOSE 101* 05/14/2012 0500   BUN 17 05/14/2012 0500   CREATININE 1.32 05/14/2012 0500   CALCIUM 8.2* 05/14/2012 0500   GFRNONAA 70* 05/14/2012 0500   GFRAA 81* 05/14/2012 0500   CBC    Component Value Date/Time   WBC 13.0* 05/14/2012 0500   RBC 4.00* 05/14/2012 0500   HGB 11.6* 05/14/2012 0500   HCT 33.7* 05/14/2012 0500   PLT 798* 05/14/2012 0500   MCV 84.3 05/14/2012 0500   MCH 29.0 05/14/2012 0500   MCHC 34.4 05/14/2012 0500   RDW 14.0 05/14/2012 0500   LYMPHSABS 3.1 05/08/2012 2231   MONOABS 1.5* 05/08/2012 2231   EOSABS 0.1 05/08/2012 2231   BASOSABS 0.1  05/08/2012 2231   CARDIAC ENZYMES Lab Results  Component Value Date   CKTOTAL 129 05/11/2012   CKMB 3.1 05/10/2012   TROPONINI <0.30 05/10/2012    Assessment/Plan:  Patient Active Hospital Problem List:   Encephalopathy acute (05/11/2012) Improving Acute respiratory failure with hypoxia (05/11/2012) Improving Cardiomyopathy (05/11/2012) Stable Systolic CHF, acute (05/11/2012) Stable  Hypotension (05/11/2012) Stable Acute renal failure (05/11/2012) Resolving Acute alcoholic hepatitis (05/11/2012) Improving Thrombocytosis (05/11/2012) Persist Hyponatremia (05/11/2012) Persist Pneumonia (05/11/2012) Stable with cough eith no fever.  Atrial fibrillation, paroxysmal   Increase ambulation Transfer to Telebed   LOS: 6 days    Orpah Cobb  MD  05/14/2012, 7:38 AM

## 2012-05-15 LAB — PROTIME-INR: Prothrombin Time: 25.1 seconds — ABNORMAL HIGH (ref 11.6–15.2)

## 2012-05-15 MED ORDER — WARFARIN SODIUM 5 MG PO TABS
5.0000 mg | ORAL_TABLET | Freq: Once | ORAL | Status: AC
  Administered 2012-05-15: 5 mg via ORAL
  Filled 2012-05-15: qty 1

## 2012-05-15 NOTE — Progress Notes (Signed)
Subjective:  Feeling better. Leg edema persisit.  Objective:  Vital Signs in the last 24 hours: Temp:  [98.3 F (36.8 C)-98.8 F (37.1 C)] 98.8 F (37.1 C) (06/15 0500) Pulse Rate:  [73-82] 80  (06/15 0949) Cardiac Rhythm:  [-] Normal sinus rhythm (06/14 1959) Resp:  [18-19] 18  (06/15 0500) BP: (101-109)/(66-75) 101/66 mmHg (06/15 0949) SpO2:  [91 %-99 %] 99 % (06/15 0500) Weight:  [72.122 kg (159 lb)] 72.122 kg (159 lb) (06/15 0500)  Physical Exam: BP Readings from Last 1 Encounters:  05/15/12 101/66    Wt Readings from Last 1 Encounters:  05/15/12 72.122 kg (159 lb)    Weight change: -1.378 kg (-3 lb 0.6 oz)  HEENT: [place]/AT, Eyes-Brown, PERL, EOMI, Conjunctiva-Pink, Sclera-Non-icteric Neck: No JVD, No bruit, Trachea midline. Lungs:  Clear, Bilateral. Cardiac:  Regular rhythm, normal S1 and S2, no S3.  Abdomen:  Soft, non-tender. Extremities:  1 + edema present. No cyanosis. No clubbing. CNS: AxOx3, Cranial nerves grossly intact, moves all 4 extremities. Right handed. Skin: Warm and dry.   Intake/Output from previous day: 06/14 0701 - 06/15 0700 In: 180.5 [P.O.:118; I.V.:50; IV Piggyback:12.5] Out: 2875 [Urine:2875]    Lab Results: BMET    Component Value Date/Time   NA 130* 05/14/2012 0500   K 4.0 05/14/2012 0500   CL 96 05/14/2012 0500   CO2 24 05/14/2012 0500   GLUCOSE 101* 05/14/2012 0500   BUN 17 05/14/2012 0500   CREATININE 1.32 05/14/2012 0500   CALCIUM 8.2* 05/14/2012 0500   GFRNONAA 70* 05/14/2012 0500   GFRAA 81* 05/14/2012 0500   CBC    Component Value Date/Time   WBC 13.0* 05/14/2012 0500   RBC 4.00* 05/14/2012 0500   HGB 11.6* 05/14/2012 0500   HCT 33.7* 05/14/2012 0500   PLT 798* 05/14/2012 0500   MCV 84.3 05/14/2012 0500   MCH 29.0 05/14/2012 0500   MCHC 34.4 05/14/2012 0500   RDW 14.0 05/14/2012 0500   LYMPHSABS 3.1 05/08/2012 2231   MONOABS 1.5* 05/08/2012 2231   EOSABS 0.1 05/08/2012 2231   BASOSABS 0.1 05/08/2012 2231   CARDIAC ENZYMES Lab Results    Component Value Date   CKTOTAL 129 05/11/2012   CKMB 3.1 05/10/2012   TROPONINI <0.30 05/10/2012    Assessment/Plan:  Patient Active Hospital Problem List:  Encephalopathy acute (05/11/2012) Improving Acute respiratory failure with hypoxia (05/11/2012) Improving Cardiomyopathy (05/11/2012) Stable Systolic CHF, acute (05/11/2012) Stable  Hypotension (05/11/2012) Stable Acute renal failure (05/11/2012) Resolving Acute alcoholic hepatitis (05/11/2012) Improving Thrombocytosis (05/11/2012) Persist Hyponatremia (05/11/2012) Persist Pneumonia (05/11/2012) Stable with cough and no fever.  Atrial fibrillation, paroxysmal   Ted for chronic leg edema Increase ambulation    LOS: 7 days    Orpah Cobb  MD  05/15/2012, 10:36 AM

## 2012-05-15 NOTE — Progress Notes (Signed)
ANTICOAGULATION CONSULT NOTE - Follow Up Consult  Pharmacy Consult for Coumadin Indication: atrial fibrillation  Allergies  Allergen Reactions  . Lopressor (Metoprolol Tartrate)     Patient Measurements: Height: 6\' 2"  (188 cm) Weight: 159 lb (72.122 kg) IBW/kg (Calculated) : 82.2   Vital Signs: Temp: 98.8 F (37.1 C) (06/15 0500) Temp src: Oral (06/15 0500) BP: 101/66 mmHg (06/15 0949) Pulse Rate: 80  (06/15 0949)  Labs:  Basename 05/15/12 0528 05/14/12 0500 05/13/12 0545 05/12/12 1726  HGB -- 11.6* 12.7* --  HCT -- 33.7* 37.7* --  PLT -- 798* 886* --  APTT -- -- -- --  LABPROT 25.1* 26.3* 30.5* --  INR 2.23* 2.37* 2.87* --  HEPARINUNFRC -- -- 0.75* 0.56  CREATININE -- 1.32 1.41* --  CKTOTAL -- -- -- --  CKMB -- -- -- --  TROPONINI -- -- -- --    Estimated Creatinine Clearance: 81.2 ml/min (by C-G formula based on Cr of 1.32).  Assessment:   INR is low therapeutic today. Some downward trend.   Has had 10 mg, 2.5 mg, 1 mg, then 4 mg of Coumadin.   On amiodarone 200 mg BID, which can increase sensitivity to Coumadin. LFTs elevated, for recheck in am.  CBC also due in am.    Goal of Therapy:  INR 2-3 Monitor platelets by anticoagulation protocol: Yes   Plan:   Coumadin 5 mg today.  PT/INR in am.  Will follow-up LFTs and CBC with you.  Dennie Fetters, [place] Pager: [phone] 05/15/2012,2:41 PM

## 2012-05-16 LAB — COMPREHENSIVE METABOLIC PANEL
ALT: 100 U/L — ABNORMAL HIGH (ref 0–53)
BUN: 7 mg/dL (ref 6–23)
Calcium: 8.4 mg/dL (ref 8.4–10.5)
GFR calc Af Amer: 83 mL/min — ABNORMAL LOW (ref >90)
Glucose, Bld: 100 mg/dL — ABNORMAL HIGH (ref 70–99)
Sodium: 134 mEq/L — ABNORMAL LOW (ref 135–145)
Total Protein: 5.9 g/dL — ABNORMAL LOW (ref 6.0–8.3)

## 2012-05-16 LAB — CULTURE, BLOOD (ROUTINE X 2)
Culture  Setup Time: 201306100902
Culture: NO GROWTH

## 2012-05-16 LAB — PROTIME-INR
INR: 2.59 — ABNORMAL HIGH (ref 0.00–1.49)
Prothrombin Time: 28.2 seconds — ABNORMAL HIGH (ref 11.6–15.2)

## 2012-05-16 LAB — DIFFERENTIAL
Basophils Absolute: 0.2 10*3/uL — ABNORMAL HIGH (ref 0.0–0.1)
Eosinophils Absolute: 0.5 10*3/uL (ref 0.0–0.7)
Lymphocytes Relative: 22 % (ref 12–46)
Neutro Abs: 5 10*3/uL (ref 1.7–7.7)
Neutrophils Relative %: 52 % (ref 43–77)

## 2012-05-16 LAB — CBC
HCT: 35.8 % — ABNORMAL LOW (ref 39.0–52.0)
MCHC: 33.5 g/dL (ref 30.0–36.0)
RDW: 14.4 % (ref 11.5–15.5)

## 2012-05-16 MED ORDER — WARFARIN SODIUM 4 MG PO TABS
4.0000 mg | ORAL_TABLET | Freq: Once | ORAL | Status: AC
  Administered 2012-05-16: 4 mg via ORAL
  Filled 2012-05-16: qty 1

## 2012-05-16 NOTE — Progress Notes (Signed)
Pt refused to use his bipap this evening, but i told him if he felt he needed it to call his nurse and ask them to call respiratory

## 2012-05-16 NOTE — Progress Notes (Signed)
Subjective:  No chest pain. Ambulating well. Afebrile. WBC down to normal.  Objective:  Vital Signs in the last 24 hours: Temp:  [98.5 F (36.9 C)-98.9 F (37.2 C)] 98.6 F (37 C) (06/16 0500) Pulse Rate:  [70-84] 79  (06/16 0500) Cardiac Rhythm:  [-] Normal sinus rhythm (06/15 1940) Resp:  [18-20] 18  (06/16 0500) BP: (101-119)/(60-88) 116/75 mmHg (06/16 0500) SpO2:  [98 %-100 %] 98 % (06/16 0500) Weight:  [73.7 kg (162 lb 7.7 oz)] 73.7 kg (162 lb 7.7 oz) (06/16 0500)  Physical Exam: BP Readings from Last 1 Encounters:  05/16/12 116/75    Wt Readings from Last 1 Encounters:  05/16/12 73.7 kg (162 lb 7.7 oz)    Weight change: 1.578 kg (3 lb 7.7 oz)  HEENT: [place]/AT, Eyes-Brown, PERL, EOMI, Conjunctiva-Pink, Sclera-Non-icteric Neck: No JVD, No bruit, Trachea midline. Lungs:  Clear, Bilateral. Cardiac:  Regular rhythm, normal S1 and S2, no S3.  Abdomen:  Soft, non-tender. Extremities:  1+ edema present. No cyanosis. No clubbing. Ted applied. CNS: AxOx3, Cranial nerves grossly intact, moves all 4 extremities. Right handed. Skin: Warm and dry.   Intake/Output from previous day: 06/15 0701 - 06/16 0700 In: 1260 [P.O.:600; I.V.:560; IV Piggyback:100] Out: 1450 [Urine:1450]    Lab Results: BMET    Component Value Date/Time   NA 134* 05/16/2012 0500   K 4.3 05/16/2012 0500   CL 101 05/16/2012 0500   CO2 25 05/16/2012 0500   GLUCOSE 100* 05/16/2012 0500   BUN 7 05/16/2012 0500   CREATININE 1.29 05/16/2012 0500   CALCIUM 8.4 05/16/2012 0500   GFRNONAA 72* 05/16/2012 0500   GFRAA 83* 05/16/2012 0500   CBC    Component Value Date/Time   WBC 9.6 05/16/2012 0500   RBC 4.15* 05/16/2012 0500   HGB 12.0* 05/16/2012 0500   HCT 35.8* 05/16/2012 0500   PLT 770* 05/16/2012 0500   MCV 86.3 05/16/2012 0500   MCH 28.9 05/16/2012 0500   MCHC 33.5 05/16/2012 0500   RDW 14.4 05/16/2012 0500   LYMPHSABS 2.1 05/16/2012 0500   MONOABS 1.8* 05/16/2012 0500   EOSABS 0.5 05/16/2012 0500   BASOSABS 0.2*  05/16/2012 0500   CARDIAC ENZYMES Lab Results  Component Value Date   CKTOTAL 129 05/11/2012   CKMB 3.1 05/10/2012   TROPONINI <0.30 05/10/2012    Assessment/Plan:  Patient Active Hospital Problem List: Encephalopathy acute (05/11/2012) Improving Acute respiratory failure with hypoxia (05/11/2012) Improving Cardiomyopathy (05/11/2012) Stable Systolic CHF, acute (05/11/2012) Stable  Hypotension (05/11/2012) Resolved Acute renal failure (05/11/2012) Resolved Acute alcoholic hepatitis (05/11/2012) Improving Thrombocytosis (05/11/2012) Persists  but better Hyponatremia (05/11/2012) Resolving Pneumonia (05/11/2012) Stable with cough and no fever.  Atrial fibrillation, paroxysmal   DC antibiotic tomorrow. Continue medical treatment   LOS: 8 days    Orpah Cobb  MD  05/16/2012, 9:06 AM

## 2012-05-16 NOTE — Progress Notes (Signed)
ANTICOAGULATION & ANTIBIOTIC CONSULT NOTE - Follow Up Consult  Pharmacy Consult for Coumadin, Zosyn Indication: atrial fibrillation, pneumonia coverage  Allergies  Allergen Reactions  . Lopressor (Metoprolol Tartrate)     Patient Measurements: Height: 6\' 2"  (188 cm) Weight: 162 lb 7.7 oz (73.7 kg) IBW/kg (Calculated) : 82.2   Vital Signs: Temp: 98.6 F (37 C) (06/16 0500) BP: 116/75 mmHg (06/16 0500) Pulse Rate: 77  (06/16 1007)  Labs:  Basename 05/16/12 0500 05/15/12 0528 05/14/12 0500  HGB 12.0* -- 11.6*  HCT 35.8* -- 33.7*  PLT 770* -- 798*  APTT -- -- --  LABPROT 28.2* 25.1* 26.3*  INR 2.59* 2.23* 2.37*  HEPARINUNFRC -- -- --  CREATININE 1.29 -- 1.32  CKTOTAL -- -- --  CKMB -- -- --  TROPONINI -- -- --    Estimated Creatinine Clearance: 84.9 ml/min (by C-G formula based on Cr of 1.29).  Assessment:   INR remains therapeutic.   Has had 10 mg, 2.5 mg, 1 mg, 4 mg, then 5 mg of Coumadin. On amiodarone 200 mg BID, which can increase sensitivity to Coumadin. LFTs elevated, but trending down. CBC about the same, ok.    Day # 6 antibiotics.  Currently on Zosyn IV and Azithromycin PO. Regimens remain appropriate for renal function.  Pneumonia noted resolving, with plan to stop antibiotics 6/17.    Goal of Therapy:  INR 2-3 Monitor platelets by anticoagulation protocol: Yes Appropriate Zosyn dosing for renal function & infection   Plan:  Coumadin 4 mg today.  PT/INR in am.  Will follow-up for d/c antibiotics 6/17.  Dennie Fetters, RPh Pager: [phone] 05/16/2012,11:56 AM

## 2012-05-16 NOTE — Progress Notes (Signed)
Pt had 5 bts nonsustained wide QRS tachycardia.  Pt without complaints and BP 119/79 and HR 79, oxygen saturation 99% on room air.  Strip placed in chart.  Will continue to monitor.

## 2012-05-17 LAB — PROTIME-INR
INR: 2.77 — ABNORMAL HIGH (ref 0.00–1.49)
Prothrombin Time: 29.7 seconds — ABNORMAL HIGH (ref 11.6–15.2)

## 2012-05-17 MED ORDER — METOPROLOL TARTRATE 25 MG PO TABS: 25.0000 mg | ORAL_TABLET | Freq: Two times a day (BID) | ORAL | Status: AC

## 2012-05-17 MED ORDER — AMIODARONE HCL 200 MG PO TABS: 200.0000 mg | ORAL_TABLET | Freq: Every day | ORAL | Status: DC

## 2012-05-17 MED ORDER — LIVING BETTER WITH HEART FAILURE BOOK
Freq: Once | Status: AC
  Administered 2012-05-17: 14:00:00
  Filled 2012-05-17: qty 1

## 2012-05-17 MED ORDER — WARFARIN SODIUM 4 MG PO TABS: 4.0000 mg | ORAL_TABLET | Freq: Once | ORAL | Status: DC

## 2012-05-17 MED ORDER — WARFARIN SODIUM 4 MG PO TABS
4.0000 mg | ORAL_TABLET | Freq: Once | ORAL | Status: AC
  Administered 2012-05-17: 4 mg via ORAL
  Filled 2012-05-17: qty 1

## 2012-05-17 MED ORDER — DIGOXIN 0.0625 MG HALF TABLET: 0.0625 mg | ORAL_TABLET | Freq: Every day | ORAL | Status: AC

## 2012-05-17 MED ORDER — FUROSEMIDE 10 MG/ML IJ SOLN
20.0000 mg | Freq: Once | INTRAMUSCULAR | Status: AC
  Administered 2012-05-17: 20 mg via INTRAVENOUS
  Filled 2012-05-17: qty 2

## 2012-05-17 NOTE — Discharge Instructions (Signed)
Atrial Fibrillation Your caregiver has diagnosed you with atrial fibrillation (AFib). The heart normally beats very regularly; AFib is a type of irregular heartbeat. The heart rate may be faster or slower than normal. This can prevent your heart from pumping as well as it should. AFib can be constant (chronic) or intermittent (paroxysmal). CAUSES  Atrial fibrillation may be caused by:  Heart disease, including heart attack, coronary artery disease, heart failure, diseases of the heart valves, and others.   Blood clot in the lungs (pulmonary embolism).   Pneumonia or other infections.   Chronic lung disease.   Thyroid disease.   Toxins. These include alcohol, some medications (such as decongestant medications or diet pills), and caffeine.  In some people, no cause for AFib can be found. This is referred to as Lone Atrial Fibrillation. SYMPTOMS   Palpitations or a fluttering in your chest.   A vague sense of chest discomfort.   Shortness of breath.   Sudden onset of lightheadedness or weakness.  Sometimes, the first sign of AFib can be a complication of the condition. This could be a stroke or heart failure. DIAGNOSIS  Your description of your condition may make your caregiver suspicious of atrial fibrillation. Your caregiver will examine your pulse to determine if fibrillation is present. An EKG (electrocardiogram) will confirm the diagnosis. Further testing may help determine what caused you to have atrial fibrillation. This may include chest x-ray, echocardiogram, blood tests, or CT scans. PREVENTION  If you have previously had atrial fibrillation, your caregiver may advise you to avoid substances known to cause the condition (such as stimulant medications, and possibly caffeine or alcohol). You may be advised to use medications to prevent recurrence. Proper treatment of any underlying condition is important to help prevent recurrence. PROGNOSIS  Atrial fibrillation does tend to  become a chronic condition over time. It can cause significant complications (see below). Atrial fibrillation is not usually immediately life-threatening, but it can shorten your life expectancy. This seems to be worse in women. If you have lone atrial fibrillation and are under 60 years old, the risk of complications is very low, and life expectancy is not shortened. RISKS AND COMPLICATIONS  Complications of atrial fibrillation can include stroke, chest pain, and heart failure. Your caregiver will recommend treatments for the atrial fibrillation, as well as for any underlying conditions, to help minimize risk of complications. TREATMENT  Treatment for AFib is divided into several categories:  Treatment of any underlying condition.   Converting you out of AFib into a regular (sinus) rhythm.   Controlling rapid heart rate.   Prevention of blood clots and stroke.  Medications and procedures are available to convert your atrial fibrillation to sinus rhythm. However, recent studies have shown that this may not offer you any advantage, and cardiac experts are continuing research and debate on this topic. More important is controlling your rapid heartbeat. The rapid heartbeat causes more symptoms, and places strain on your heart. Your caregiver will advise you on the use of medications that can control your heart rate. Atrial fibrillation is a strong stroke risk. You can lessen this risk by taking blood thinning medications such as Coumadin (warfarin), or sometimes aspirin. These medications need close monitoring by your caregiver. Over-medication can cause bleeding. Too little medication may not protect against stroke. HOME CARE INSTRUCTIONS   If your caregiver prescribed medicine to make your heartbeat more normally, take as directed.   If blood thinners were prescribed by your caregiver, take EXACTLY as directed.     Perform blood tests EXACTLY as directed.   Quit smoking. Smoking increases your  cardiac and lung (pulmonary) risks.   DO NOT drink alcohol.   DO NOT drink caffeinated drinks (e.g. coffee, soda, chocolate, and leaf teas). You may drink decaffeinated coffee, soda or tea.   If you are overweight, you should choose a reduced calorie diet to lose weight. Please see a registered dietitian if you need more information about healthy weight loss. DO NOT USE DIET PILLS as they may aggravate heart problems.   If you have other heart problems that are causing AFib, you may need to eat a low salt, fat, and cholesterol diet. Your caregiver will tell you if this is necessary.   Exercise every day to improve your physical fitness. Stay active unless advised otherwise.   If your caregiver has given you a follow-up appointment, it is very important to keep that appointment. Not keeping the appointment could result in heart failure or stroke. If there is any problem keeping the appointment, you must call back to this facility for assistance.  SEEK MEDICAL CARE IF:  You notice a change in the rate, rhythm or strength of your heartbeat.   You develop an infection or any other change in your overall health status.  SEEK IMMEDIATE MEDICAL CARE IF:   You develop chest pain, abdominal pain, sweating, weakness or feel sick to your stomach (nausea).   You develop shortness of breath.   You develop swollen feet and ankles.   You develop dizziness, numbness, or weakness of your face or limbs, or any change in vision or speech.  MAKE SURE YOU:   Understand these instructions.   Will watch your condition.   Will get help right away if you are not doing well or get worse.  Document Released: 11/17/2005 Document Revised: 11/06/2011 Document Reviewed: 06/21/2008 [facility] Patient Information 2012 [place], [place].Alcoholic Hepatitis Alcoholic hepatitis is a condition of the liver characterized by direct injury to the liver cells from alcohol abuse. This disease can occur after years of  excessive drinking, or it may occur after isolated episodes of heavy alcohol consumption, such as binge drinking. The symptoms may include:  Loss of appetite.   Nausea or vomiting.   Abdominal pain.   Fever.   Jaundice (yellowing of the skin or whites of the eyes).   Dark urine.   Swelling of the liver (fullness in your right upper abdomen).  Vomiting blood, fainting, memory loss, blackouts and hallucinations can develop as the disease progresses. This becomes worse with longer abuse. The more you drink, the greater the risk for severe alcoholic hepatitis. You do not have to drink to the point of being "drunk" to cause liver damage. Permanent liver damage with cirrhosis, liver failure and death can develop if you continue to drink. A poor diet is a big part of the problem. Heavy drinkers do not eat a balanced diet. Essential foods and vitamins may be missing from the diet. This leads to malnutrition, which damages the liver further, and can damage the brain, nerves, stomach and other organ systems. Treating alcoholic hepatitis means that you need to stop drinking and start eating a well-balanced diet including appropriate calories, proteins, vitamins and certain fats. You should also take vitamin supplements, especially vitamin B1 (thiamine) and folic acid. You must stay away from alcohol to recover. Many of the effects of acute alcoholic hepatitis are reversible, if you avoid further alcohol use. Avoid other drugs that are toxic to the liver; your caregiver  can tell you which drugs these are. Joining a recovery group such as AA or seeking a rehabilitation clinic or group can help give you the support you need to make this change possible. SEEK IMMEDIATE MEDICAL CARE IF:   You have increasing abdominal pain.   You have persistent vomiting or are unable to tolerate oral nutrition.   You have blood in your vomit.   You have confusion, lethargy (a lack of energy) or any change in your normal  mental abilities.   You develop jaundice.  Document Released: 12/25/2004 Document Revised: 11/06/2011 Document Reviewed: 11/19/2009 [facility] Patient Information 2012 [place], [place].

## 2012-05-17 NOTE — Plan of Care (Signed)
Problem: Discharge Progression Outcomes Goal: If EF < 40% ACEI/ARB addressed at discharge Outcome: Completed/Met Date Met:  05/17/12 Patient has renal insufficiency

## 2012-05-17 NOTE — Discharge Summary (Signed)
Physician Discharge Summary  Patient ID: Matthew Joseph MRN: [id number] DOB/AGE: Nov 24, 1981 31 y.o.  Admit date: 05/08/2012 Discharge date: 05/17/2012  Admission Diagnoses: Cardiomyopathy  Systolic CHF, acute  Acute renal failure  Acute alcoholic hepatitis  Acute respiratory failure with hypoxia  Hypotension  Thrombocytosis  Hyponatremia  Pneumonia  Discharge Diagnoses:  Active Problems:  Cardiomyopathy  Systolic CHF, acute* Principle problem  Acute renal failure  Acute alcoholic hepatitis  Encephalopathy acute  Acute respiratory failure with hypoxia  Hypotension  Thrombocytosis  Hyponatremia  Pneumonia   Discharged Condition: good  Hospital Course: 31 years old black male with h/o alcohol abuse has leg edema and cough worsening over few days. No fever. No chest pain. He had alcoholic cardiomyopathy. He improved with IV lasix but then developed a renal dysfunction and encephalopathy/alcohol withdrawal syndrom. His condition slowly improved post giving IV fluids.   Consults: cardiology  Significant Diagnostic Studies: labs: Normal H/H and electrolytes. Elevated Platelets count. Normal cardiac enzymes. Elevated Pro-BNP over 3K.  Radiology: WUJ:[id number]. Right-sided pleural effusion with basilar atelectasis. Central pulmonary vascular prominence. Left base subsegmental atelectasis  CT scan: Ventricle size is normal. Negative for infarct, mass, or hemorrhage. Brain appears normal. Calvarium is intact.  Echocardiogram Left ventricle: The cavity size was mildly dilated. There was mild concentric hypertrophy.Thin band running across the middle portion of the ventricle. Systolic function was moderately reduced. The estimated ejection fraction was in the range of 35% to 40%. Moderate diffuse hypokinesis. - Aortic valve: Mild regurgitation. - Mitral valve: Severe regurgitation. - Left atrium: The atrium was mildly to moderately dilated. - Right ventricle: The cavity size  was mildly dilated. Wall thickness was normal. Systolic function was moderately reduced. - Right atrium: The atrium was moderately dilated. - Tricuspid valve: Severe regurgitation. - Pulmonary arteries: Systolic pressure was mildly increased. PA peak pressure: 40mm Hg (S). - Pericardium, extracardiac: A trivial pericardial effusion was identified posterior to the heart.   Treatments: antibiotics: Zosyn and cardiac meds: carvedilol, digoxin, furosemide and amiodarone  Discharge Exam: Blood pressure 116/74, pulse 82, temperature 98.2 F (36.8 C), temperature source Oral, resp. rate 18, height 6\' 2"  (1.88 m), weight 73.1 kg (161 lb 2.5 oz), SpO2 99.00%. Head: Normocephalic, without obvious abnormality, atraumatic  Eyes: Brown,conjunctivae/corneas clear. PERRL, EOM's intact.  Throat: lips, mucosa, and tongue normal; teeth and gums normal  Neck: no adenopathy, no carotid bruit, no JVD, supple, symmetrical, trachea midline and thyroid not enlarged, symmetric  Resp: rhonchi bibasilar  Cardio: irregularly irregular rhythm. S1, S2 normal. III/VI systolic murmur GI: soft, non-tender; bowel sounds normal.  Extremities: extremities normal, atraumatic, no cyanosis. 1 + edema  Neurologic: Alert and oriented X 3, normal strength and tone. Normal symmetric reflexes. Normal coordination and gait   Disposition: 01-Home or Self Care   Medication List  As of 05/17/2012  4:26 PM   STOP taking these medications         ibuprofen 200 MG tablet         TAKE these medications         amiodarone 200 MG tablet   Commonly known as: PACERONE   Take 1 tablet (200 mg total) by mouth daily.      digoxin 0.0625 mg Tabs   Commonly known as: LANOXIN   Take 0.5 tablets (0.0625 mg total) by mouth daily.      guaiFENesin 600 MG 12 hr tablet   Commonly known as: MUCINEX   Take 1,200 mg by mouth 2 (two) times daily.  metoprolol tartrate 25 MG tablet   Commonly known as: LOPRESSOR   Take 1 tablet (25 mg  total) by mouth 2 (two) times daily.      multivitamin with minerals Tabs   Take 1 tablet by mouth daily.      warfarin 4 MG tablet   Commonly known as: COUMADIN   Take 1 tablet (4 mg total) by mouth one time only at 6 PM.           Follow-up Information    Follow up with [facility] S, MD. Schedule an appointment as soon as possible for a visit in 1 week.   Contact information:   [address] [place] [address] [phone]          Signed: Ricki Rodriguez 05/17/2012, 4:26 PM

## 2012-05-17 NOTE — Progress Notes (Signed)
Orders received to d/c pt.  D/c instructions and medications reviewed with pt.  Pt verbalizes understanding.  Pt shows no s/s of distress.  Pt d/c'ed to home.

## 2012-05-17 NOTE — Progress Notes (Signed)
Pt smokes 1 cigarette per day but says he is done with smoking and has already quit. encouraged pt to quit completely. Referred to 1-800 quit now for f/u and support. Discussed oral fixation substitutes, second hand smoke and in home smoking policy. Reviewed and gave pt Written education/contact information.

## 2012-05-17 NOTE — Progress Notes (Signed)
ANTICOAGULATION CONSULT NOTE - Follow Up Consult  Pharmacy Consult for Coumadin Indication: atrial fibrillation  Allergies  Allergen Reactions  . Lopressor (Metoprolol Tartrate)     Patient Measurements: Height: 6\' 2"  (188 cm) Weight: 161 lb 2.5 oz (73.1 kg) IBW/kg (Calculated) : 82.2   Vital Signs: Temp: 98.2 F (36.8 C) (06/17 0600) BP: 125/87 mmHg (06/17 0738) Pulse Rate: 89  (06/17 0929)  Labs:  Basename 05/17/12 0500 05/16/12 0500 05/15/12 0528  HGB -- 12.0* --  HCT -- 35.8* --  PLT -- 770* --  APTT -- -- --  LABPROT 29.7* 28.2* 25.1*  INR 2.77* 2.59* 2.23*  HEPARINUNFRC -- -- --  CREATININE -- 1.29 --  CKTOTAL -- -- --  CKMB -- -- --  TROPONINI -- -- --    Estimated Creatinine Clearance: 84.2 ml/min (by C-G formula based on Cr of 1.29).  Assessment:   INR remains therapeutic = 2.77 today on coumadin for Afib.    Has had 10 mg, 2.5 mg, 1 mg, 4 mg,  5 mg then 4mg   of Coumadin. On amiodarone 200 mg BID, which can increase sensitivity to Coumadin. LFTs elevated, but trending down. CBC yesterday ~stable. No bleeding reported.    Day # 7 antibiotics.  Currently on Zosyn IV and Azithromycin PO.     Goal of Therapy:  INR 2-3 Monitor platelets by anticoagulation protocol: Yes Appropriate Zosyn dosing for renal function & infection   Plan:  Coumadin 4 mg today.  PT/INR in am.  Will follow-up for d/c antibiotics.  Noah Delaine, RPh Clinical Pharmacist 05/17/2012,10:34 AM

## 2012-05-17 NOTE — Progress Notes (Signed)
RT note;Pt refusing to wear BIPAP,states that its to uncomfortable to wear.

## 2012-05-17 NOTE — Progress Notes (Signed)
Patient given [facility] Heart Failure information and discussed contents with patient.

## 2013-01-16 ENCOUNTER — Encounter (HOSPITAL_COMMUNITY): Payer: Self-pay | Admitting: Family Medicine

## 2013-01-16 ENCOUNTER — Emergency Department (HOSPITAL_COMMUNITY): Payer: Self-pay

## 2013-01-16 ENCOUNTER — Emergency Department (HOSPITAL_COMMUNITY)
Admission: EM | Admit: 2013-01-16 | Discharge: 2013-01-16 | Disposition: A | Discharge status: Home / Self Care | Payer: Self-pay | Attending: Emergency Medicine | Admitting: Emergency Medicine

## 2013-01-16 DIAGNOSIS — Z79899 Other long term (current) drug therapy: Secondary | ICD-10-CM | POA: Insufficient documentation

## 2013-01-16 DIAGNOSIS — R509 Fever, unspecified: Secondary | ICD-10-CM | POA: Insufficient documentation

## 2013-01-16 DIAGNOSIS — J020 Streptococcal pharyngitis: Secondary | ICD-10-CM | POA: Insufficient documentation

## 2013-01-16 DIAGNOSIS — Z7901 Long term (current) use of anticoagulants: Secondary | ICD-10-CM | POA: Insufficient documentation

## 2013-01-16 DIAGNOSIS — I10 Essential (primary) hypertension: Secondary | ICD-10-CM | POA: Insufficient documentation

## 2013-01-16 DIAGNOSIS — I4891 Unspecified atrial fibrillation: Secondary | ICD-10-CM | POA: Insufficient documentation

## 2013-01-16 DIAGNOSIS — R05 Cough: Secondary | ICD-10-CM | POA: Insufficient documentation

## 2013-01-16 DIAGNOSIS — R059 Cough, unspecified: Secondary | ICD-10-CM | POA: Insufficient documentation

## 2013-01-16 DIAGNOSIS — F17200 Nicotine dependence, unspecified, uncomplicated: Secondary | ICD-10-CM | POA: Insufficient documentation

## 2013-01-16 HISTORY — DX: Essential (primary) hypertension: I10

## 2013-01-16 HISTORY — DX: Unspecified atrial fibrillation: I48.91

## 2013-01-16 MED ORDER — AMOXICILLIN 500 MG PO CAPS: 500.0000 mg | ORAL_CAPSULE | Freq: Three times a day (TID) | ORAL | Status: DC

## 2013-01-16 NOTE — ED Notes (Signed)
Per pt sts congestion, body aches and fever. sts took tylenol. Pt denies cough. sts doctor sent him here for chest xray

## 2013-01-16 NOTE — ED Provider Notes (Signed)
History     CSN: [id number]  Arrival date & time 01/16/13  1191   First MD Initiated Contact with Patient 01/16/13 [phone]      Chief Complaint  Patient presents with  . Nasal Congestion    (Consider location/radiation/quality/duration/timing/severity/associated sxs/prior treatment) HPI Comments: Patient comes to the ER with complaints of fever, body aches, sinus congestion, sore throat and cough. Symptoms began yesterday. Patient called his doctor and was told to go to the ER for a chest x-ray.    Past Medical History  Diagnosis Date  . Atrial fibrillation   . Hypertension     History reviewed. No pertinent past surgical history.  History reviewed. No pertinent family history.  History  Substance Use Topics  . Smoking status: Current Every Day Smoker -- 0.25 packs/day for 10 years  . Smokeless tobacco: Not on file  . Alcohol Use: 0.0 oz/week    2-3 drink(s) per week      Review of Systems  Constitutional: Positive for fever and chills.  HENT: Positive for congestion, sore throat and sinus pressure.   Respiratory: Positive for cough. Negative for shortness of breath.   All other systems reviewed and are negative.    Allergies  Lopressor  Home Medications   Current Outpatient Rx  Name  Route  Sig  Dispense  Refill  . amiodarone (PACERONE) 200 MG tablet   Oral   Take 1 tablet (200 mg total) by mouth daily.   30 tablet   1   . digoxin (LANOXIN) 0.0625 mg TABS   Oral   Take 0.5 tablets (0.0625 mg total) by mouth daily.   15 tablet   1   . guaiFENesin (MUCINEX) 600 MG 12 hr tablet   Oral   Take 1,200 mg by mouth 2 (two) times daily.         . metoprolol tartrate (LOPRESSOR) 25 MG tablet   Oral   Take 1 tablet (25 mg total) by mouth 2 (two) times daily.   60 tablet   1   . Multiple Vitamin (MULTIVITAMIN WITH MINERALS) TABS   Oral   Take 1 tablet by mouth daily.         Marland Kitchen warfarin (COUMADIN) 4 MG tablet   Oral   Take 1 tablet (4 mg total) by  mouth one time only at 6 PM.   30 tablet   1     BP 152/89  Pulse 118  Temp(Src) 99 F (37.2 C)  Resp 18  SpO2 99%  Physical Exam  Constitutional: He is oriented to person, place, and time. He appears well-developed and well-nourished. No distress.  HENT:  Head: Normocephalic and atraumatic.  Right Ear: Hearing normal.  Nose: Nose normal. Right sinus exhibits no maxillary sinus tenderness and no frontal sinus tenderness. Left sinus exhibits no maxillary sinus tenderness and no frontal sinus tenderness.  Mouth/Throat: Oropharynx is clear and moist and mucous membranes are normal.  Eyes: Conjunctivae and EOM are normal. Pupils are equal, round, and reactive to light.  Neck: Normal range of motion. Neck supple.  Cardiovascular: Normal rate, regular rhythm, S1 normal and S2 normal.  Exam reveals no gallop and no friction rub.   No murmur heard. Pulmonary/Chest: Effort normal and breath sounds normal. No respiratory distress. He exhibits no tenderness.  Abdominal: Soft. Normal appearance and bowel sounds are normal. There is no hepatosplenomegaly. There is no tenderness. There is no rebound, no guarding, no tenderness at McBurney's point and negative Murphy's sign. No hernia.  Musculoskeletal: Normal range of motion.  Neurological: He is alert and oriented to person, place, and time. He has normal strength. No cranial nerve deficit or sensory deficit. Coordination normal. GCS eye subscore is 4. GCS verbal subscore is 5. GCS motor subscore is 6.  Skin: Skin is warm, dry and intact. No rash noted. No cyanosis.  Psychiatric: He has a normal mood and affect. His speech is normal and behavior is normal. Thought content normal.    ED Course  Procedures (including critical care time)  Labs Reviewed  RAPID STREP SCREEN   Dg Chest 2 View  01/16/2013  *RADIOLOGY REPORT*  Clinical Data: Cough and fever.  CHEST - 2 VIEW  Comparison: 05/12/2012  Findings: The cardiomediastinal silhouette is  unremarkable. Lungs are clear. There is no evidence of focal airspace disease, pulmonary edema, suspicious pulmonary nodule/mass, pleural effusion, or pneumothorax. No acute bony abnormalities are identified.  IMPRESSION: No evidence of active cardiopulmonary disease.   Original Report Authenticated By: Harmon Pier, M.D.      Diagnosis: Strep Throat    MDM  Treat with amox for strep throat.        Gilda Crease, MD 01/16/13 [phone]

## 2015-04-27 DIAGNOSIS — I10 Essential (primary) hypertension: Secondary | ICD-10-CM | POA: Insufficient documentation

## 2015-04-27 DIAGNOSIS — Z79899 Other long term (current) drug therapy: Secondary | ICD-10-CM | POA: Insufficient documentation

## 2015-04-27 DIAGNOSIS — Z792 Long term (current) use of antibiotics: Secondary | ICD-10-CM | POA: Insufficient documentation

## 2015-04-27 DIAGNOSIS — Z7901 Long term (current) use of anticoagulants: Secondary | ICD-10-CM | POA: Insufficient documentation

## 2015-04-27 DIAGNOSIS — I4891 Unspecified atrial fibrillation: Secondary | ICD-10-CM | POA: Insufficient documentation

## 2015-04-27 DIAGNOSIS — K047 Periapical abscess without sinus: Secondary | ICD-10-CM | POA: Insufficient documentation

## 2015-04-27 DIAGNOSIS — Z720 Tobacco use: Secondary | ICD-10-CM | POA: Insufficient documentation

## 2015-04-27 DIAGNOSIS — K0381 Cracked tooth: Secondary | ICD-10-CM | POA: Insufficient documentation

## 2015-04-27 DIAGNOSIS — K029 Dental caries, unspecified: Secondary | ICD-10-CM | POA: Insufficient documentation

## 2015-04-28 ENCOUNTER — Encounter (HOSPITAL_COMMUNITY): Payer: Self-pay | Admitting: Emergency Medicine

## 2015-04-28 ENCOUNTER — Emergency Department (HOSPITAL_COMMUNITY)
Admission: EM | Admit: 2015-04-28 | Discharge: 2015-04-28 | Disposition: A | Discharge status: Home / Self Care | Payer: Self-pay | Attending: Emergency Medicine | Admitting: Emergency Medicine

## 2015-04-28 DIAGNOSIS — K029 Dental caries, unspecified: Secondary | ICD-10-CM

## 2015-04-28 DIAGNOSIS — K047 Periapical abscess without sinus: Secondary | ICD-10-CM

## 2015-04-28 MED ORDER — CLINDAMYCIN HCL 300 MG PO CAPS
300.0000 mg | ORAL_CAPSULE | Freq: Once | ORAL | Status: AC
Start: 2015-04-28 — End: 2015-04-28
  Administered 2015-04-28: 300 mg via ORAL
  Filled 2015-04-28: qty 2
  Filled 2015-04-28: qty 1

## 2015-04-28 MED ORDER — MELOXICAM 7.5 MG PO TABS: 7.5000 mg | ORAL_TABLET | Freq: Every day | ORAL | Status: DC

## 2015-04-28 MED ORDER — MAGIC MOUTHWASH: 5.0000 mL | Freq: Three times a day (TID) | ORAL | Status: DC | PRN

## 2015-04-28 MED ORDER — KETOROLAC TROMETHAMINE 60 MG/2ML IM SOLN
60.0000 mg | Freq: Once | INTRAMUSCULAR | Status: AC
  Administered 2015-04-28: 60 mg via INTRAMUSCULAR
  Filled 2015-04-28: qty 2

## 2015-04-28 MED ORDER — CLINDAMYCIN HCL 300 MG PO CAPS: 300.0000 mg | ORAL_CAPSULE | Freq: Four times a day (QID) | ORAL | Status: DC

## 2015-04-28 NOTE — ED Provider Notes (Signed)
CSN: [id number]     Arrival date & time 04/27/15  2337 History  This chart was scribed for Mandalyn Pasqua, MD found by Rayna Sexton, ED scribe. This patient was seen in room A11C/A11C and the patient's care was started at 1:46 AM    Chief Complaint  Patient presents with  . Dental Pain    Patient is a 34 y.o. male presenting with tooth pain. The history is provided by the patient. No language interpreter was used.  Dental Pain Location:  Lower Lower teeth location:  18/LL 2nd molar, 19/LL 1st molar and 20/LL 2nd bicuspid Quality:  Sharp Severity:  Moderate Onset quality:  Gradual Duration:  5 days Timing:  Constant Progression:  Worsening Chronicity:  New Context: abscess, dental caries, dental fracture and poor dentition   Context: not trauma   Previous work-up:  Dental exam and filled cavity Relieved by:  Nothing Worsened by:  Nothing tried Ineffective treatments: OTC pain medications and salt water rinse. Associated symptoms: facial pain   Associated symptoms: no congestion, no difficulty swallowing, no drooling, no facial swelling, no fever, no neck swelling, no oral bleeding and no trismus   Risk factors: smoking     HPI Comments: Matthew Joseph is a 35 y.o. male who presents to the Emergency Department complaining of constant, moderate, left lower dental pain with associated swelling with onset 5 days ago. Pt suspects possible abscess in the affected area. Pt is currently taking OTC pain medication and gargling with warm salt water without relief. Pt has not scheduled an appointment with a dentist. He denies trouble swallowing. Pt confirms not having been to dentist for a long period of time.  Past Medical History  Diagnosis Date  . Atrial fibrillation   . Hypertension    History reviewed. No pertinent past surgical history. No family history on file. History  Substance Use Topics  . Smoking status: Current Every Day Smoker -- 0.25 packs/day for 10 years  .  Smokeless tobacco: Not on file  . Alcohol Use: 0.0 oz/week    2-3 drink(s) per week    Review of Systems  Constitutional: Negative for fever.  HENT: Positive for dental problem. Negative for congestion, drooling, facial swelling and trouble swallowing.   All other systems reviewed and are negative.     Allergies  Review of patient's allergies indicates no known allergies.  Home Medications   Prior to Admission medications   Medication Sig Start Date End Date Taking? Authorizing Provider  acetaminophen (TYLENOL) 500 MG tablet Take 1,000 mg by mouth every 6 (six) hours as needed for pain or fever.    Historical Provider, MD  amiodarone (PACERONE) 200 MG tablet Take 1 tablet (200 mg total) by mouth daily. 05/17/12 05/17/13  Dixie Dials, MD  amoxicillin (AMOXIL) 500 MG capsule Take 1 capsule (500 mg total) by mouth 3 (three) times daily. 01/16/13   Orpah Greek, MD  digoxin (LANOXIN) 0.0625 mg TABS Take 0.5 tablets (0.0625 mg total) by mouth daily. 05/17/12   Dixie Dials, MD  guaiFENesin (MUCINEX) 600 MG 12 hr tablet Take by mouth 2 (two) times daily.     Historical Provider, MD  metoprolol tartrate (LOPRESSOR) 25 MG tablet Take 1 tablet (25 mg total) by mouth 2 (two) times daily. 05/17/12 05/17/13  Dixie Dials, MD  PRESCRIPTION MEDICATION Take 1 tablet by mouth at bedtime. Blood pressure medication.    Historical Provider, MD  warfarin (COUMADIN) 4 MG tablet Take 4-6 mg by mouth one time only  at 6 PM. Take one tablet on Sunday, Tuesday, Thursday and Saturday. Take 1 and one-half tablet on Monday, Wednesday, and Friday. 05/17/12 05/17/13  Dixie Dials, MD   BP 154/93 mmHg  Pulse 74  Temp(Src) 99.2 F (37.3 C) (Oral)  Resp 20  SpO2 100% Physical Exam  Constitutional: He is oriented to person, place, and time. He appears well-developed and well-nourished. No distress.  HENT:  Head: Normocephalic and atraumatic.  Mouth/Throat: Oropharynx is clear and moist. No oropharyngeal  exudate.  5 necrotic teeth with a ruptured abscess All teeth broken 1 broken below gumline  Carries on left uppers and right uppers Carries on right lowers No swelling in floor of the mouth No swelling of lips, tongue or uvula   Eyes: Conjunctivae and EOM are normal. Pupils are equal, round, and reactive to light.  Neck: Normal range of motion. Neck supple. No tracheal deviation present.  Trachea midline  Cardiovascular: Normal rate, regular rhythm, normal heart sounds and intact distal pulses.   Pulmonary/Chest: Effort normal and breath sounds normal. No stridor. No respiratory distress. He has no wheezes. He has no rales.  Abdominal: Soft. Bowel sounds are normal. There is no tenderness. There is no rebound and no guarding.  Musculoskeletal: Normal range of motion.  Lymphadenopathy:    He has no cervical adenopathy.  Neurological: He is alert and oriented to person, place, and time.  Skin: Skin is warm and dry.  Psychiatric: His behavior is normal.  Nursing note and vitals reviewed.   ED Course  Procedures  DIAGNOSTIC STUDIES: Oxygen Saturation is 100% on RA, normal by my interpretation.    COORDINATION OF CARE: 1:52 AM Discussed treatment plan with pt at bedside and pt agreed to plan.  Labs Review Labs Reviewed - No data to display  Imaging Review No results found.   EKG Interpretation None      MDM   Final diagnoses:  None    No swelling abscess ruptured on own needs multiple teeth extracted will start clindamycin QID and refer to dentistry.  Strict return precautions.     I personally performed the services described in this documentation, which was scribed in my presence. The recorded information has been reviewed and is accurate.     Veatrice Kells, MD 04/28/15 [phone]

## 2015-04-28 NOTE — Discharge Instructions (Signed)
Dental Care and Dentist Visits °Dental care supports good overall health. Regular dental visits can also help you avoid dental pain, bleeding, infection, and other more serious health problems in the future. It is important to keep the mouth healthy because diseases in the teeth, gums, and other oral tissues can spread to other areas of the body. Some problems, such as diabetes, heart disease, and pre-term labor have been associated with poor oral health.  °See your dentist every 6 months. If you experience emergency problems such as a toothache or broken tooth, go to the dentist right away. If you see your dentist regularly, you may catch problems early. It is easier to be treated for problems in the early stages.  °WHAT TO EXPECT AT A DENTIST VISIT  °Your dentist will look for many common oral health problems and recommend proper treatment. At your regular dental visit, you can expect: °· Gentle cleaning of the teeth and gums. This includes scraping and polishing. This helps to remove the sticky substance around the teeth and gums (plaque). Plaque forms in the mouth shortly after eating. Over time, plaque hardens on the teeth as tartar. If tartar is not removed regularly, it can cause problems. Cleaning also helps remove stains. °· Periodic X-rays. These pictures of the teeth and supporting bone will help your dentist assess the health of your teeth. °· Periodic fluoride treatments. Fluoride is a natural mineral shown to help strengthen teeth. Fluoride treatment involves applying a fluoride gel or varnish to the teeth. It is most commonly done in children. °· Examination of the mouth, tongue, jaws, teeth, and gums to look for any oral health problems, such as: °¨ Cavities (dental caries). This is decay on the tooth caused by plaque, sugar, and acid in the mouth. It is best to catch a cavity when it is small. °¨ Inflammation of the gums caused by plaque buildup (gingivitis). °¨ Problems with the mouth or malformed  or misaligned teeth. °¨ Oral cancer or other diseases of the soft tissues or jaws.  °KEEP YOUR TEETH AND GUMS HEALTHY °For healthy teeth and gums, follow these general guidelines as well as your dentist's specific advice: °· Have your teeth professionally cleaned at the dentist every 6 months. °· Brush twice daily with a fluoride toothpaste. °· Floss your teeth daily.  °· Ask your dentist if you need fluoride supplements, treatments, or fluoride toothpaste. °· Eat a healthy diet. Reduce foods and drinks with added sugar. °· Avoid smoking. °TREATMENT FOR ORAL HEALTH PROBLEMS °If you have oral health problems, treatment varies depending on the conditions present in your teeth and gums. °· Your caregiver will most likely recommend good oral hygiene at each visit. °· For cavities, gingivitis, or other oral health disease, your caregiver will perform a procedure to treat the problem. This is typically done at a separate appointment. Sometimes your caregiver will refer you to another dental specialist for specific tooth problems or for surgery. °SEEK IMMEDIATE DENTAL CARE IF: °· You have pain, bleeding, or soreness in the gum, tooth, jaw, or mouth area. °· A permanent tooth becomes loose or separated from the gum socket. °· You experience a blow or injury to the mouth or jaw area. °Document Released: 07/30/2011 Document Revised: 02/09/2012 Document Reviewed: 07/30/2011 °ExitCare® Patient Information ©2015 [employer]. This information is not intended to replace advice given to you by your health care provider. Make sure you discuss any questions you have with your health care provider. ° °Emergency Department Resource Guide °1) Find a Doctor   and Pay Out of Pocket °Although you won't have to find out who is covered by your insurance plan, it is a good idea to ask around and get recommendations. You will then need to call the office and see if the doctor you have chosen will accept you as a new patient and what types of  options they offer for patients who are self-pay. Some doctors offer discounts or will set up payment plans for their patients who do not have insurance, but you will need to ask so you aren't surprised when you get to your appointment. ° °2) Contact Your Local Health Department °Not all health departments have doctors that can see patients for sick visits, but many do, so it is worth a call to see if yours does. If you don't know where your local health department is, you can check in your phone book. The CDC also has a tool to help you locate your state's health department, and many state websites also have listings of all of their local health departments. ° °3) Find a Walk-in Clinic °If your illness is not likely to be very severe or complicated, you may want to try a walk in clinic. These are popping up all over the country in pharmacies, drugstores, and shopping centers. They're usually staffed by nurse practitioners or physician assistants that have been trained to treat common illnesses and complaints. They're usually fairly quick and inexpensive. However, if you have serious medical issues or chronic medical problems, these are probably not your best option. ° °No Primary Care Doctor: °- Call [facility] at  832-8000 - they can help you locate a primary care doctor that  accepts your insurance, provides certain services, etc. °- Physician Referral Service- 1-800-533-3463 ° °Chronic Pain Problems: °Organization         Address  Phone   Notes  °[facility]  (336) 297-2271 Patients need to be referred by their primary care doctor.  ° °Medication Assistance: °Organization         Address  Phone   Notes  °[place] [facility] [address] °[place], [place] [address] (336) 641-8030 --Must be a resident of [place] °-- Must have NO insurance coverage whatsoever (no Medicaid/ Medicare, etc.) °-- The pt. MUST have a primary care doctor that directs  their care regularly and follows them in the community °  °[facility]  (866) 331-1348   °[employer]  (888) 892-1162   ° °Agencies that provide inexpensive medical care: °Organization         Address  Phone   Notes  °[facility]  (336) 832-8035   °[facility]    (336) 832-7272   °[facility] [address] °[place], [place] [address] (336) 832-4777   °[facility] [address], °[place] (336) 271-4999   °[facility]    (336) 373-0678   °[facility]    (336) 272-1050   °[facility] ° [address], [place] Phone:  (336) 832-4444, Fax:  (336) 832-4440 Hours of Operation:  9 am - 6 pm, M-F.  Also accepts Medicaid/Medicare and self-pay.  °[facility] ° [address],  Phone: (336) 832-3150, Fax: (336) 832-3151. Hours of Operation:  8:30 am - 5:30 pm, M-F.  Also accepts Medicaid and self-pay.  °[facility] [place] [address], [place] Phone: (336) 878-6027   °  [facility] [address], [place], [place] (336)723-1848, Ext. 123 Mondays & Thursdays: 7-9 AM.  First 15 patients are seen on a first come, first serve basis. °  ° °Medicaid-accepting [place] Providers: ° °Organization         Address  Phone   Notes  °[facility] [address], [place] (336) 641-2100 Also accepts self-pay patients.  °[facility] [address], [place] ° (336) 856-9996   °[facility] [address], [place] (336) 288-8857   °[facility] [address] [place] [address], [place] (336) 299-7000   °Veita Bland [address], [place]  ° (336) 373-1557 Only accepts [place] Access Medicaid patients after they have their name applied to their card.  ° °Self-Pay (no insurance) in [place]: ° °Organization          Address  Phone   Notes  °Sickle Cell Patients, [facility] [address], [place] (336) 832-1970   °[facility] [address], [place] (336) 832-4400   °[facility] ° [address], [place] (336) 992-4800   °[facility]/Dr. Osei-Bonsu ° [address] [place] [address], [place] or [address], [place] (336) 841-8500 Phone number for both [place] and [place] locations is the same.  °[facility] [address], [place] (336) 299-0000   °[facility] [address] [place] [address], [place] or [address] (336) 852-7530 °(336) 878-2260   °[facility] [address], [place] (336) 350-1642, phone; (336) 294-5005, fax Sees patients 1st and 3rd Saturday of every month.  Must not qualify for public or private insurance (i.e. Medicaid, Medicare, [place] Health Choice, Veterans' Benefits) • Household income should be no more than 200% of the poverty level •The clinic cannot treat you if you are pregnant or think you are pregnant • Sexually transmitted diseases are not treated at the clinic.  ° ° °Dental Care: °Organization         Address  Phone  Notes  °[facility] [address], [place] (336) 641-6152 Accepts children up to age 21 who are enrolled in Medicaid or [place] Health Choice; pregnant women with a Medicaid card; and children who have applied for Medicaid or [place] Health Choice, but were declined, whose parents can pay a reduced fee at time of service.  °[facility] [place]  [address], [place] (336) 641-7733 Accepts children up to age 21 who are enrolled in Medicaid or [place] Health Choice; pregnant women with a Medicaid card; and children who have applied for Medicaid or  Health Choice, but were declined, whose parents can pay a reduced fee at time of  service.  °[facility] ° [address], [place] (336) 641-4533 Patients are seen by appointment only. Walk-ins are not accepted. [facility] will see patients 18 years of age and older. °Monday - Tuesday (8am-5pm) °Most Wednesdays (8:30-5pm) °$30 per visit, cash only  °[facility] ° [address], [place] (336) 641-4533 Patients are seen by appointment only. Walk-ins are not accepted. [facility] will see patients 18 years of age and older. °One Wednesday Evening (Monthly: Volunteer Based).  $30 per visit,   cash only  °[facility]  (919) 537-3737 for adults; Children under age 4, call Graduate Pediatric Dentistry at ([phone]) [phone]. Children aged 4-14, please call ([phone]) [phone] to request a pediatric application. ° Dental services are provided in all areas of dental care including fillings, crowns and bridges, complete and partial dentures, implants, gum treatment, root canals, and extractions. Preventive care is also provided. Treatment is provided to both adults and children. °Patients are selected via a lottery and there is often a waiting list. °  °[facility] [address], °[place] ° (336) 763-8833 [email or web address] °  °[facility] [address], [place], [place] (336)723-1848, Ext. 123 Second and Fourth Thursday of each month, opens at 6:30 AM; Clinic ends at 9 AM.  Patients are seen on a first-come first-served basis, and a limited number are seen during each clinic.  ° °[facility] ° [address], [place], [place] (336) 723-7904   Eligibility Requirements °You must have lived in [place], [place], or [place] counties for at least the last three months. °  You cannot be eligible for state or federal sponsored healthcare insurance, including Veterans Administration, Medicaid, or Medicare. °  You generally cannot be eligible for healthcare insurance through your employer.   °  How to apply: °Eligibility screenings are held every Tuesday and Wednesday afternoon from 1:00 pm until 4:00 pm. You do not need an appointment for the interview!  °[facility] [address], [place], [place] 336-631-2330   °[facility]  336-342-8273   °[facility]  336-703-3100   °[facility]  336-570-6415   ° °Behavioral Health Resources in the Community: °Intensive Outpatient Programs °Organization         Address  Phone  Notes  °[facility] [address], [place], [place] 336-878-6098   °[facility] [address], [place], [place] 336-832-9800   °[facility] [address], [place], [place] ° 336-882-2125   °[facility] [address],  °[place], [place] 1-800-853-5163 or 336-641-4981   °Substance Abuse Resources °Organization         Address  Phone  Notes  °[facility]  336-882-2125   °[facility]  336-784-9470   °[facility]  336-285-9073   °[facility]  336-845-3988   °Residential & Outpatient Substance Abuse Program  1-800-659-3381   °Psychological Services °Organization         Address  Phone  Notes  °[facility]  336- 832-9600   °[facility]  336- 378-7881   °[facility] [address], [place] 1-800-853-5163 or 336-641-4981   ° °Mobile Crisis Teams °Organization         Address  Phone  Notes  °[facility], Mobile Crisis Care Unit  1-877-626-1772   °Assertive °[facility] ° [address] [place], [place] 336-834-9664   °Sharon DeEsch [address] °[place] 336-554-5454   ° °Self-Help/Support Groups °Organization         Address  Phone             Notes  °[facility] - variety of support groups  336- 373-1402 Call for more information  °Narcotics Anonymous (NA), [facility] [address], °[place]  2 meetings at this location  ° °Residential Treatment Programs °Organization           Address  Phone  Notes  °[facility] [address],    °[place]  1-866-801-8205   °[facility] ° [address], [place], [place] 704-293-8524   °[facility] [address], [place] 336-845-3988 Admissions: 8am-3pm M-F  °[facility] [address]    °[place], [place] 336-841-1104   °[facility] [address], [place], [place] 336-379-7146   °[facility] [address]  °[place], [place] 336-285-9073   °[facility] - Intensive Outpatient [address], [place], [place] 336-852-3033   °[facility] [address]  °[place], [place] 1-877-615-2722 or 336-784-9470   °[facility] [address] [place], [place] 336-227-7417 Accepts Medicaid  °[facility] [address]  °[place] 1-800-659-3381 Substance Abuse/Addiction Treatment  ° °[place] Behavioral Health Resources °Organization         Address  Phone  Notes  °[facility]  (888) 581-9988   °Julie Brannon, PhD [address] [place], [place]   (336) 349-5553 or (336) 951-0000   °[facility]   [address] °[place], [place] (336) 349-4454   °[facility] [address], [place], [place] (336) 342-8316 Insurance/Medicaid/sponsorship through [facility]  °[facility] [address]                                    [place], [place] (336) 342-8316 Therapy/tele-psych/case  °[facility] [address]  ° [place], [place] (336) 349-2233    °Dr. Arfeen  (336) 349-4544   °[facility] [place]  [employer] [place] [facility] 1) [address], [place] °2) [address], [place] °3)  [address], [place] (336) 349-3220 °(336) 342-7768 ° °(336) 342-8140   °[place] [facility]  [facility] (336) 342-1394 or (336) 342-3537 (After Hours)    ° °

## 2015-04-28 NOTE — ED Notes (Signed)
C/o L lower toothache/abscess with facial swelling x 1 week.

## 2015-04-29 ENCOUNTER — Telehealth (HOSPITAL_COMMUNITY): Payer: Self-pay

## 2018-11-09 ENCOUNTER — Emergency Department (HOSPITAL_COMMUNITY)
Admission: EM | Admit: 2018-11-09 | Discharge: 2018-11-09 | Disposition: A | Discharge status: Home / Self Care | Payer: Self-pay | Attending: Emergency Medicine | Admitting: Emergency Medicine

## 2018-11-09 ENCOUNTER — Emergency Department (HOSPITAL_COMMUNITY): Payer: Self-pay

## 2018-11-09 ENCOUNTER — Other Ambulatory Visit: Payer: Self-pay

## 2018-11-09 ENCOUNTER — Encounter (HOSPITAL_COMMUNITY): Payer: Self-pay | Admitting: Emergency Medicine

## 2018-11-09 DIAGNOSIS — R609 Edema, unspecified: Secondary | ICD-10-CM

## 2018-11-09 DIAGNOSIS — R17 Unspecified jaundice: Secondary | ICD-10-CM | POA: Insufficient documentation

## 2018-11-09 DIAGNOSIS — R600 Localized edema: Secondary | ICD-10-CM | POA: Insufficient documentation

## 2018-11-09 DIAGNOSIS — F17200 Nicotine dependence, unspecified, uncomplicated: Secondary | ICD-10-CM | POA: Insufficient documentation

## 2018-11-09 DIAGNOSIS — Z79899 Other long term (current) drug therapy: Secondary | ICD-10-CM | POA: Insufficient documentation

## 2018-11-09 DIAGNOSIS — I110 Hypertensive heart disease with heart failure: Secondary | ICD-10-CM | POA: Insufficient documentation

## 2018-11-09 DIAGNOSIS — I5020 Unspecified systolic (congestive) heart failure: Secondary | ICD-10-CM | POA: Insufficient documentation

## 2018-11-09 LAB — BASIC METABOLIC PANEL
ANION GAP: 11 (ref 5–15)
BUN: 18 mg/dL (ref 6–20)
CALCIUM: 8.6 mg/dL — AB (ref 8.9–10.3)
CHLORIDE: 90 mmol/L — AB (ref 98–111)
CO2: 23 mmol/L (ref 22–32)
Creatinine, Ser: 1.2 mg/dL (ref 0.61–1.24)
GFR calc Af Amer: >60 mL/min (ref >60)
Glucose, Bld: 104 mg/dL — ABNORMAL HIGH (ref 70–99)
Potassium: 4.9 mmol/L (ref 3.5–5.1)
Sodium: 124 mmol/L — ABNORMAL LOW (ref 135–145)

## 2018-11-09 LAB — HEPATIC FUNCTION PANEL
ALBUMIN: 2.8 g/dL — AB (ref 3.5–5.0)
ALT: 39 U/L (ref 0–44)
AST: 70 U/L — AB (ref 15–41)
Alkaline Phosphatase: 73 U/L (ref 38–126)
Bilirubin, Direct: 1.2 mg/dL — ABNORMAL HIGH (ref 0.0–0.2)
Indirect Bilirubin: 1 mg/dL — ABNORMAL HIGH (ref 0.3–0.9)
Total Bilirubin: 2.2 mg/dL — ABNORMAL HIGH (ref 0.3–1.2)
Total Protein: 7.8 g/dL (ref 6.5–8.1)

## 2018-11-09 LAB — BRAIN NATRIURETIC PEPTIDE: B Natriuretic Peptide: 556 pg/mL — ABNORMAL HIGH (ref 0.0–100.0)

## 2018-11-09 MED ORDER — FUROSEMIDE 10 MG/ML IJ SOLN
40.0000 mg | Freq: Once | INTRAMUSCULAR | Status: AC
  Administered 2018-11-09: 40 mg via INTRAVENOUS
  Filled 2018-11-09: qty 4

## 2018-11-09 NOTE — ED Triage Notes (Signed)
Pt reports bilateral lower leg swelling over the last week, more on the right. Pt followed up with primary doctor who increased his Lasix with some swelling decrease. Hx of Afib and HTN, compliant with medications.

## 2018-11-09 NOTE — ED Notes (Signed)
Patient verbalizes understanding of discharge instructions. Opportunity for questioning and answers were provided. Armband removed by staff, pt discharged from ED.  

## 2018-11-09 NOTE — ED Notes (Signed)
Pt to xray

## 2018-11-09 NOTE — ED Notes (Signed)
Patient transported to X-ray 

## 2018-11-09 NOTE — ED Notes (Signed)
Lab called for Hgb recollect. Showed 3.0 in lab work.

## 2018-11-09 NOTE — Discharge Instructions (Signed)
Please continue to take your lasix as directed. If you experience any shortness of breath, chest pain or worsening symptoms you  may return to the ED for reevaluation.

## 2018-11-09 NOTE — ED Provider Notes (Signed)
[place] EMERGENCY DEPARTMENT Provider Note   CSN: [id number] Arrival date & time: 11/09/18  1620     History   Chief Complaint Chief Complaint  Patient presents with  . Leg Swelling    HPI Matthew Joseph is a 37 y.o. male.  37 y.o male with a PMH of HTN, Afib, CHF, alcoholic hepatitis presents to the ED with a chief complaint of bilateral leg swelling x 1 week. Patient reports no previous similar episode like this one.Patient currently works overnight standing and reports shooting pain which is worse after his shift but relieved with sitting. He was seen by his PCP on Thursday 11/04/2018 who adjusted his furosemide from 20 mg to 40 mg b.i.d. He reports some improvement in symptoms. Patient has also tried taking some aspirin for his leg pain. He denies any orthopnea, shortness of breath, chest pain or urinary symptoms.      Past Medical History:  Diagnosis Date  . Atrial fibrillation ([place])   . Hypertension     Patient Active Problem List   Diagnosis Date Noted  . Encephalopathy acute 05/11/2012  . Acute respiratory failure with hypoxia ([place]) 05/11/2012  . Cardiomyopathy ([place]) 05/11/2012  . Systolic CHF, acute ([place]) 05/11/2012  . Hypotension 05/11/2012  . Acute renal failure ([place]) 05/11/2012  . Acute alcoholic hepatitis 40/98/1191  . Thrombocytosis ([place]) 05/11/2012  . Hyponatremia 05/11/2012  . Pneumonia 05/11/2012    History reviewed. No pertinent surgical history.      Home Medications    Prior to Admission medications   Medication Sig Start Date End Date Taking? Authorizing Provider  acetaminophen (TYLENOL) 500 MG tablet Take 1,000 mg by mouth every 6 (six) hours as needed for pain or fever.    [provider]  Alum & Mag Hydroxide-Simeth (MAGIC MOUTHWASH) SOLN Take 5 mLs by mouth 3 (three) times daily as needed for mouth pain. 04/28/15   Palumbo, April, MD  amiodarone (PACERONE) 200 MG tablet Take 1 tablet (200 mg total) by  mouth daily. 05/17/12 04/28/15  Dixie Dials, MD  amLODipine (NORVASC) 5 MG tablet Take 5 mg by mouth at bedtime.    [provider]  clindamycin (CLEOCIN) 300 MG capsule Take 1 capsule (300 mg total) by mouth 4 (four) times daily. X 7 days 04/28/15   Palumbo, April, MD  digoxin (LANOXIN) 0.0625 mg TABS Take 0.5 tablets (0.0625 mg total) by mouth daily. 05/17/12   Dixie Dials, MD  guaiFENesin (MUCINEX) 600 MG 12 hr tablet Take by mouth 2 (two) times daily.     [provider]  meloxicam (MOBIC) 7.5 MG tablet Take 1 tablet (7.5 mg total) by mouth daily. 04/28/15   Palumbo, April, MD  metoprolol tartrate (LOPRESSOR) 25 MG tablet Take 1 tablet (25 mg total) by mouth 2 (two) times daily. Patient taking differently: Take 12.5 mg by mouth 2 (two) times daily.  05/17/12 04/28/15  Dixie Dials, MD  Multiple Vitamin (MULTIVITAMIN WITH MINERALS) TABS tablet Take 1 tablet by mouth daily.    [provider]  warfarin (COUMADIN) 4 MG tablet Take 4-6 mg by mouth one time only at 6 PM. Take one tablet on Sunday, Tuesday, Thursday and Saturday. Take 1 and one-half tablet on Monday, Wednesday, and Friday. 05/17/12 04/28/15  Dixie Dials, MD    Family History No family history on file.  Social History Social History   Tobacco Use  . Smoking status: Current Every Day Smoker    Packs/day: 0.25    Years:  10.00    Pack years: 2.50  . Smokeless tobacco: Never Used  Substance Use Topics  . Alcohol use: Yes    Alcohol/week: 2.0 - 3.0 standard drinks    Types: 2 - 3 Standard drinks or equivalent per week    Comment: occ  . Drug use: Yes    Types: Cocaine, Marijuana     Allergies   Patient has no known allergies.   Review of Systems Review of Systems  Constitutional: Negative for fever.  HENT: Negative for sore throat.   Respiratory: Negative for shortness of breath.   Cardiovascular: Positive for leg swelling. Negative for chest pain.  Gastrointestinal: Negative for abdominal  pain, nausea and vomiting.  Genitourinary: Negative for dysuria and flank pain.  Musculoskeletal: Negative for back pain and neck pain.  Skin: Negative for pallor and wound.  Neurological: Negative for light-headedness and headaches.     Physical Exam Updated Vital Signs BP 136/65   Pulse 76   Temp (!) 97.5 F (36.4 C)   Resp 16   Ht 6\' 3"  (1.905 m)   Wt 70.8 kg   SpO2 100%   BMI 19.50 kg/m   Physical Exam  Constitutional: He is oriented to person, place, and time. He appears well-developed and well-nourished.  Non-toxic appearance. He does not have a sickly appearance. He does not appear ill.  3+ pitting edema right > left   HENT:  Head: Normocephalic and atraumatic.  Eyes: Pupils are equal, round, and reactive to light. Scleral icterus is present.  Slight scleral icterus  Neck: Normal range of motion. Neck supple.  Cardiovascular: Normal heart sounds.  Pulmonary/Chest: Effort normal. He has no wheezes. He has no rales.  Abdominal: Soft. There is no tenderness.  Neurological: He is alert and oriented to person, place, and time.  Skin: Skin is warm and dry.  Slight jaundice.   Nursing note and vitals reviewed.    ED Treatments / Results  Labs (all labs ordered are listed, but only abnormal results are displayed) Labs Reviewed  BASIC METABOLIC PANEL - Abnormal; Notable for the following components:      Result Value   Sodium 124 (*)    Chloride 90 (*)    Glucose, Bld 104 (*)    Calcium 8.6 (*)    All other components within normal limits  HEPATIC FUNCTION PANEL - Abnormal; Notable for the following components:   Albumin 2.8 (*)    AST 70 (*)    Total Bilirubin 2.2 (*)    Bilirubin, Direct 1.2 (*)    Indirect Bilirubin 1.0 (*)    All other components within normal limits  BRAIN NATRIURETIC PEPTIDE - Abnormal; Notable for the following components:   B Natriuretic Peptide 556.0 (*)    All other components within normal limits  CBC WITH DIFFERENTIAL/PLATELET     EKG None  Radiology Dg Chest 2 View  Result Date: 11/09/2018 CLINICAL DATA:  Shortness of breath with physical exertion for 2 days. On Lasix. EXAM: CHEST - 2 VIEW COMPARISON:  Chest radiograph January 16, 2013 FINDINGS: Cardiac silhouette is mild-to-moderately enlarged, increased from prior examination. Mild pulmonary vascular congestion without pleural effusion or focal consolidation. No pneumothorax. Biapical pleural thickening. Soft tissue planes and included osseous structures are non suspicious. IMPRESSION: Cardiomegaly and pulmonary vascular congestion. Electronically Signed   By: Elon Alas M.D.   On: 11/09/2018 19:31    Procedures Procedures (including critical care time)  Medications Ordered in ED Medications  furosemide (LASIX) injection 40  mg (40 mg Intravenous Given 11/09/18 2030)     Initial Impression / Assessment and Plan / ED Course  I have reviewed the triage vital signs and the nursing notes.  Pertinent labs & imaging results that were available during my care of the patient were reviewed by me and considered in my medical decision making (see chart for details).     Resents with bilateral leg swelling x1 week.  He was seen by PCP who increased his Lasix to 40 mg twice daily.  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  She reports he did not take his Lasix today, given IV Lasix help with the swelling.  BMP revealed slight decrease in sodium, patient likely volume overloaded.  Chloride was slightly decreased.  Hepatic function panel showed an elevation of AST he does have a previous history of alcoholic hepatitis but reports he does not drink currently.  ENP was discontinued by lab due to error, re-collected at 952 currently 556.  His previous chest x-ray was compared to today's and it shows mild to target increase in swelling and fluid retention.  Patient denies any  shortness of breath at this time.  He was provided with IV Lasix 40 mg.  Ports he is used the bathroom multiple times while in the ED.  I have had a shared decision conversation with patient and explained that he needs to return if he experiences any shortness of breath, chest pain or worsening symptoms.  Patient and family at the bedside understand and agree with plan.  Vitals stable during ED visit, patient stable for discharge.  Final Clinical Impressions(s) / ED Diagnoses   Final diagnoses:  Peripheral edema    ED Discharge Orders    None       Janeece Fitting, PA-C 11/09/18 [id number]    Duffy Bruce, MD 11/10/18 1259

## 2018-11-22 ENCOUNTER — Encounter (HOSPITAL_COMMUNITY): Payer: Self-pay | Admitting: Emergency Medicine

## 2018-11-22 ENCOUNTER — Inpatient Hospital Stay (HOSPITAL_COMMUNITY)
Admission: EM | Admit: 2018-11-22 | Discharge: 2018-11-26 | DRG: 813 | Disposition: A | Discharge status: Home / Self Care | Payer: Self-pay | Attending: Internal Medicine | Admitting: Internal Medicine

## 2018-11-22 ENCOUNTER — Inpatient Hospital Stay (HOSPITAL_COMMUNITY): Payer: Self-pay

## 2018-11-22 ENCOUNTER — Other Ambulatory Visit: Payer: Self-pay

## 2018-11-22 ENCOUNTER — Emergency Department (HOSPITAL_COMMUNITY): Payer: Self-pay

## 2018-11-22 DIAGNOSIS — T45515A Adverse effect of anticoagulants, initial encounter: Secondary | ICD-10-CM | POA: Diagnosis present

## 2018-11-22 DIAGNOSIS — Z7901 Long term (current) use of anticoagulants: Secondary | ICD-10-CM

## 2018-11-22 DIAGNOSIS — F1210 Cannabis abuse, uncomplicated: Secondary | ICD-10-CM | POA: Diagnosis present

## 2018-11-22 DIAGNOSIS — D689 Coagulation defect, unspecified: Secondary | ICD-10-CM

## 2018-11-22 DIAGNOSIS — I272 Pulmonary hypertension, unspecified: Secondary | ICD-10-CM | POA: Diagnosis present

## 2018-11-22 DIAGNOSIS — D473 Essential (hemorrhagic) thrombocythemia: Secondary | ICD-10-CM | POA: Diagnosis present

## 2018-11-22 DIAGNOSIS — E871 Hypo-osmolality and hyponatremia: Secondary | ICD-10-CM | POA: Diagnosis present

## 2018-11-22 DIAGNOSIS — Z9114 Patient's other noncompliance with medication regimen: Secondary | ICD-10-CM

## 2018-11-22 DIAGNOSIS — I872 Venous insufficiency (chronic) (peripheral): Secondary | ICD-10-CM | POA: Diagnosis present

## 2018-11-22 DIAGNOSIS — I482 Chronic atrial fibrillation, unspecified: Secondary | ICD-10-CM

## 2018-11-22 DIAGNOSIS — I5021 Acute systolic (congestive) heart failure: Secondary | ICD-10-CM | POA: Diagnosis present

## 2018-11-22 DIAGNOSIS — Z79899 Other long term (current) drug therapy: Secondary | ICD-10-CM

## 2018-11-22 DIAGNOSIS — Z833 Family history of diabetes mellitus: Secondary | ICD-10-CM

## 2018-11-22 DIAGNOSIS — R0602 Shortness of breath: Secondary | ICD-10-CM

## 2018-11-22 DIAGNOSIS — I480 Paroxysmal atrial fibrillation: Secondary | ICD-10-CM | POA: Diagnosis present

## 2018-11-22 DIAGNOSIS — D649 Anemia, unspecified: Secondary | ICD-10-CM | POA: Diagnosis present

## 2018-11-22 DIAGNOSIS — F1020 Alcohol dependence, uncomplicated: Secondary | ICD-10-CM | POA: Diagnosis present

## 2018-11-22 DIAGNOSIS — K7030 Alcoholic cirrhosis of liver without ascites: Secondary | ICD-10-CM | POA: Diagnosis present

## 2018-11-22 DIAGNOSIS — Z8249 Family history of ischemic heart disease and other diseases of the circulatory system: Secondary | ICD-10-CM

## 2018-11-22 DIAGNOSIS — I5023 Acute on chronic systolic (congestive) heart failure: Secondary | ICD-10-CM | POA: Diagnosis present

## 2018-11-22 DIAGNOSIS — I10 Essential (primary) hypertension: Secondary | ICD-10-CM

## 2018-11-22 DIAGNOSIS — Z8619 Personal history of other infectious and parasitic diseases: Secondary | ICD-10-CM

## 2018-11-22 DIAGNOSIS — F1010 Alcohol abuse, uncomplicated: Secondary | ICD-10-CM

## 2018-11-22 DIAGNOSIS — I081 Rheumatic disorders of both mitral and tricuspid valves: Secondary | ICD-10-CM | POA: Diagnosis present

## 2018-11-22 DIAGNOSIS — Z791 Long term (current) use of non-steroidal anti-inflammatories (NSAID): Secondary | ICD-10-CM

## 2018-11-22 DIAGNOSIS — D6832 Hemorrhagic disorder due to extrinsic circulating anticoagulants: Principal | ICD-10-CM | POA: Diagnosis present

## 2018-11-22 DIAGNOSIS — N179 Acute kidney failure, unspecified: Secondary | ICD-10-CM | POA: Diagnosis present

## 2018-11-22 DIAGNOSIS — F17210 Nicotine dependence, cigarettes, uncomplicated: Secondary | ICD-10-CM | POA: Diagnosis present

## 2018-11-22 DIAGNOSIS — I110 Hypertensive heart disease with heart failure: Secondary | ICD-10-CM | POA: Diagnosis present

## 2018-11-22 DIAGNOSIS — K7460 Unspecified cirrhosis of liver: Secondary | ICD-10-CM

## 2018-11-22 DIAGNOSIS — D62 Acute posthemorrhagic anemia: Secondary | ICD-10-CM | POA: Diagnosis present

## 2018-11-22 DIAGNOSIS — I426 Alcoholic cardiomyopathy: Secondary | ICD-10-CM | POA: Diagnosis present

## 2018-11-22 DIAGNOSIS — I313 Pericardial effusion (noninflammatory): Secondary | ICD-10-CM | POA: Diagnosis present

## 2018-11-22 HISTORY — DX: Heart failure, unspecified: I50.9

## 2018-11-22 LAB — CBC WITH DIFFERENTIAL/PLATELET
ABS IMMATURE GRANULOCYTES: 0 10*3/uL (ref 0.00–0.07)
BASOS ABS: 0.2 10*3/uL — AB (ref 0.0–0.1)
BASOS ABS: 0 10*3/uL (ref 0.0–0.1)
Basophils Relative: 1 %
Basophils Relative: 0 %
EOS PCT: 1 %
Eosinophils Absolute: 0 10*3/uL (ref 0.0–0.5)
Eosinophils Absolute: 0.1 10*3/uL (ref 0.0–0.5)
Eosinophils Relative: 0 %
HCT: 12.2 % — ABNORMAL LOW (ref 39.0–52.0)
HCT: 11.3 % — ABNORMAL LOW (ref 39.0–52.0)
HEMOGLOBIN: 2.9 g/dL — AB (ref 13.0–17.0)
Hemoglobin: 2.7 g/dL — CL (ref 13.0–17.0)
LYMPHS ABS: 1.9 10*3/uL (ref 0.7–4.0)
Lymphocytes Relative: 12 %
Lymphocytes Relative: 11 %
Lymphs Abs: 1.5 10*3/uL (ref 0.7–4.0)
MCH: 14.8 pg — AB (ref 26.0–34.0)
MCH: 15.2 pg — ABNORMAL LOW (ref 26.0–34.0)
MCHC: 23.8 g/dL — ABNORMAL LOW (ref 30.0–36.0)
MCHC: 23.9 g/dL — ABNORMAL LOW (ref 30.0–36.0)
MCV: 62.2 fL — ABNORMAL LOW (ref 80.0–100.0)
MCV: 63.5 fL — ABNORMAL LOW (ref 80.0–100.0)
Monocytes Absolute: 1.9 10*3/uL — ABNORMAL HIGH (ref 0.1–1.0)
Monocytes Absolute: 2.5 10*3/uL — ABNORMAL HIGH (ref 0.1–1.0)
Monocytes Relative: 12 %
Monocytes Relative: 17 %
NEUTROS ABS: 12.1 10*3/uL — AB (ref 1.7–7.7)
NEUTROS ABS: 10.4 10*3/uL — AB (ref 1.7–7.7)
Neutrophils Relative %: 75 %
Neutrophils Relative %: 71 %
PLATELETS: 540 10*3/uL — AB (ref 150–400)
Platelets: 502 10*3/uL — ABNORMAL HIGH (ref 150–400)
RBC: 1.96 MIL/uL — ABNORMAL LOW (ref 4.22–5.81)
RBC: 1.78 MIL/uL — AB (ref 4.22–5.81)
RDW: 25.2 % — AB (ref 11.5–15.5)
RDW: 24.9 % — AB (ref 11.5–15.5)
WBC: 16.1 10*3/uL — ABNORMAL HIGH (ref 4.0–10.5)
WBC: 14.6 10*3/uL — AB (ref 4.0–10.5)
nRBC: 2.9 % — ABNORMAL HIGH (ref 0.0–0.2)
nRBC: 9 /100 WBC — ABNORMAL HIGH

## 2018-11-22 LAB — COMPREHENSIVE METABOLIC PANEL
ALK PHOS: 68 U/L (ref 38–126)
ALT: 48 U/L — ABNORMAL HIGH (ref 0–44)
ALT: 43 U/L (ref 0–44)
AST: 86 U/L — ABNORMAL HIGH (ref 15–41)
AST: 80 U/L — ABNORMAL HIGH (ref 15–41)
Albumin: 2.7 g/dL — ABNORMAL LOW (ref 3.5–5.0)
Albumin: 2.6 g/dL — ABNORMAL LOW (ref 3.5–5.0)
Alkaline Phosphatase: 65 U/L (ref 38–126)
Anion gap: 11 (ref 5–15)
Anion gap: 11 (ref 5–15)
BUN: 18 mg/dL (ref 6–20)
BUN: 19 mg/dL (ref 6–20)
CALCIUM: 8.5 mg/dL — AB (ref 8.9–10.3)
CHLORIDE: 90 mmol/L — AB (ref 98–111)
CO2: 22 mmol/L (ref 22–32)
CO2: 22 mmol/L (ref 22–32)
Calcium: 8.2 mg/dL — ABNORMAL LOW (ref 8.9–10.3)
Chloride: 90 mmol/L — ABNORMAL LOW (ref 98–111)
Creatinine, Ser: 1.54 mg/dL — ABNORMAL HIGH (ref 0.61–1.24)
Creatinine, Ser: 1.39 mg/dL — ABNORMAL HIGH (ref 0.61–1.24)
GFR calc Af Amer: >60 mL/min (ref >60)
GFR calc non Af Amer: 56 mL/min — ABNORMAL LOW (ref >60)
GFR calc non Af Amer: >60 mL/min (ref >60)
Glucose, Bld: 117 mg/dL — ABNORMAL HIGH (ref 70–99)
Glucose, Bld: 112 mg/dL — ABNORMAL HIGH (ref 70–99)
POTASSIUM: 4.4 mmol/L (ref 3.5–5.1)
Potassium: 4.2 mmol/L (ref 3.5–5.1)
SODIUM: 123 mmol/L — AB (ref 135–145)
Sodium: 123 mmol/L — ABNORMAL LOW (ref 135–145)
Total Bilirubin: 2.7 mg/dL — ABNORMAL HIGH (ref 0.3–1.2)
Total Bilirubin: 2.7 mg/dL — ABNORMAL HIGH (ref 0.3–1.2)
Total Protein: 8.1 g/dL (ref 6.5–8.1)
Total Protein: 7.6 g/dL (ref 6.5–8.1)

## 2018-11-22 LAB — POC OCCULT BLOOD, ED: FECAL OCCULT BLD: NEGATIVE

## 2018-11-22 LAB — BRAIN NATRIURETIC PEPTIDE
B Natriuretic Peptide: 726.2 pg/mL — ABNORMAL HIGH (ref 0.0–100.0)
B Natriuretic Peptide: 723.7 pg/mL — ABNORMAL HIGH (ref 0.0–100.0)

## 2018-11-22 LAB — I-STAT CHEM 8, ED
BUN: 19 mg/dL (ref 6–20)
Calcium, Ion: 1.09 mmol/L — ABNORMAL LOW (ref 1.15–1.40)
Chloride: 90 mmol/L — ABNORMAL LOW (ref 98–111)
Creatinine, Ser: 1.5 mg/dL — ABNORMAL HIGH (ref 0.61–1.24)
Glucose, Bld: 110 mg/dL — ABNORMAL HIGH (ref 70–99)
HCT: 15 % — ABNORMAL LOW (ref 39.0–52.0)
HEMOGLOBIN: 5.1 g/dL — AB (ref 13.0–17.0)
Potassium: 4.2 mmol/L (ref 3.5–5.1)
Sodium: 125 mmol/L — ABNORMAL LOW (ref 135–145)
TCO2: 25 mmol/L (ref 22–32)

## 2018-11-22 LAB — I-STAT TROPONIN, ED
Troponin i, poc: 0.02 ng/mL (ref 0.00–0.08)
Troponin i, poc: 0.01 ng/mL (ref 0.00–0.08)

## 2018-11-22 LAB — RETICULOCYTES
Immature Retic Fract: 18.3 % — ABNORMAL HIGH (ref 2.3–15.9)
RBC.: 1.79 MIL/uL — ABNORMAL LOW (ref 4.22–5.81)
Retic Count, Absolute: 41.9 10*3/uL (ref 19.0–186.0)
Retic Ct Pct: 2.3 % (ref 0.4–3.1)

## 2018-11-22 LAB — PREPARE RBC (CROSSMATCH)

## 2018-11-22 LAB — FOLATE: Folate: 29 ng/mL (ref >5.9)

## 2018-11-22 LAB — IRON AND TIBC
Iron: 11 ug/dL — ABNORMAL LOW (ref 45–182)
Saturation Ratios: 2 % — ABNORMAL LOW (ref 17.9–39.5)
TIBC: 535 ug/dL — ABNORMAL HIGH (ref 250–450)
UIBC: 524 ug/dL

## 2018-11-22 LAB — PROTIME-INR
INR: >10
Prothrombin Time: 84.4 seconds — ABNORMAL HIGH (ref 11.4–15.2)

## 2018-11-22 LAB — ABO/RH: ABO/RH(D): B POS

## 2018-11-22 LAB — FERRITIN: Ferritin: 18 ng/mL — ABNORMAL LOW (ref 24–336)

## 2018-11-22 LAB — MRSA PCR SCREENING: MRSA by PCR: NEGATIVE

## 2018-11-22 LAB — VITAMIN B12: Vitamin B-12: 2342 pg/mL — ABNORMAL HIGH (ref 180–914)

## 2018-11-22 MED ORDER — ORAL CARE MOUTH RINSE
15.0000 mL | Freq: Two times a day (BID) | OROMUCOSAL | Status: DC
  Administered 2018-11-24: 15 mL via OROMUCOSAL

## 2018-11-22 MED ORDER — ACETAMINOPHEN 500 MG PO TABS: 500.0000 mg | ORAL_TABLET | Freq: Four times a day (QID) | ORAL | Status: DC | PRN

## 2018-11-22 MED ORDER — PHYTONADIONE 5 MG PO TABS
5.0000 mg | ORAL_TABLET | Freq: Once | ORAL | Status: AC
  Administered 2018-11-22: 5 mg via ORAL
  Filled 2018-11-22 (×2): qty 1

## 2018-11-22 MED ORDER — METOPROLOL TARTRATE 25 MG PO TABS
25.0000 mg | ORAL_TABLET | Freq: Two times a day (BID) | ORAL | Status: DC
  Administered 2018-11-23 – 2018-11-26 (×6): 25 mg via ORAL
  Filled 2018-11-22 (×6): qty 1

## 2018-11-22 MED ORDER — ONDANSETRON HCL 4 MG PO TABS: 4.0000 mg | ORAL_TABLET | Freq: Four times a day (QID) | ORAL | Status: DC | PRN

## 2018-11-22 MED ORDER — FUROSEMIDE 10 MG/ML IJ SOLN: 20.0000 mg | Freq: Once | INTRAMUSCULAR | Status: DC

## 2018-11-22 MED ORDER — POTASSIUM CHLORIDE CRYS ER 10 MEQ PO TBCR
10.0000 meq | EXTENDED_RELEASE_TABLET | Freq: Every day | ORAL | Status: DC
  Administered 2018-11-22 – 2018-11-26 (×5): 10 meq via ORAL
  Filled 2018-11-22 (×5): qty 1

## 2018-11-22 MED ORDER — CHLORHEXIDINE GLUCONATE 0.12 % MT SOLN
15.0000 mL | Freq: Two times a day (BID) | OROMUCOSAL | Status: DC
  Administered 2018-11-23 – 2018-11-25 (×6): 15 mL via OROMUCOSAL
  Filled 2018-11-22 (×6): qty 15

## 2018-11-22 MED ORDER — SODIUM CHLORIDE 0.9% IV SOLUTION
Freq: Once | INTRAVENOUS | Status: AC
  Administered 2018-11-22: 13:00:00 via INTRAVENOUS

## 2018-11-22 MED ORDER — FUROSEMIDE 10 MG/ML IJ SOLN
20.0000 mg | Freq: Two times a day (BID) | INTRAMUSCULAR | Status: DC
  Administered 2018-11-22 – 2018-11-23 (×2): 20 mg via INTRAVENOUS
  Filled 2018-11-22 (×2): qty 2

## 2018-11-22 MED ORDER — LOSARTAN POTASSIUM 25 MG PO TABS
25.0000 mg | ORAL_TABLET | Freq: Every day | ORAL | Status: DC
  Administered 2018-11-24 – 2018-11-26 (×3): 25 mg via ORAL
  Filled 2018-11-22 (×3): qty 1

## 2018-11-22 MED ORDER — ONDANSETRON HCL 4 MG/2ML IJ SOLN: 4.0000 mg | Freq: Four times a day (QID) | INTRAMUSCULAR | Status: DC | PRN

## 2018-11-22 MED ORDER — DIGOXIN 125 MCG PO TABS
0.0625 mg | ORAL_TABLET | Freq: Every day | ORAL | Status: DC
  Administered 2018-11-23 – 2018-11-26 (×4): 0.0625 mg via ORAL
  Filled 2018-11-22 (×4): qty 1

## 2018-11-22 MED ORDER — SODIUM CHLORIDE 0.9% IV SOLUTION: Freq: Once | INTRAVENOUS | Status: DC

## 2018-11-22 MED ORDER — AMIODARONE HCL 200 MG PO TABS: 200.0000 mg | ORAL_TABLET | Freq: Every day | ORAL | Status: DC

## 2018-11-22 NOTE — ED Notes (Signed)
ED Provider at bedside. 

## 2018-11-22 NOTE — ED Notes (Signed)
EDP notified of low hgb

## 2018-11-22 NOTE — ED Provider Notes (Signed)
[facility] EMERGENCY DEPARTMENT Provider Note   CSN: [id number] Arrival date & time: 11/22/18  4098     History   Chief Complaint Chief Complaint  Patient presents with  . Cough  . Shortness of Breath    HPI Matthew Joseph is a 37 y.o. male.  Matthew Joseph is a 37 y.o. male with a history of CHF, hypertension, atrial fibrillation on warfarin, and alcohol abuse, who presents to the emergency department for evaluation of shortness of breath.  He reports shortness of breath is been present worsening over the past week.  He reports it is primarily worse when he is up moving around, but he does have some mild shortness of breath at rest.  He reports an occasional cough that has been productive of clear sputum.  He denies any associated chest pain.  Has not had any lightheadedness or syncope., but has felt more fatigued.  Denies any abdominal pain, nausea or vomiting.  No fevers or chills.  He has not had any melena, hematochezia or diarrhea.  Reports he was seen at the beginning of the month with similar shortness of breath and was told he had some fluid around his heart likely causing leg swelling, his diuretics were increased to 40 mg twice daily and he has been trying his best to take them regularly and reports he at least takes them once a day.  He reports he has had some mild swelling in his legs but this is overall improved since then.  No history of PE or DVT.  No aggravating or alleviating factors.     Past Medical History:  Diagnosis Date  . Atrial fibrillation ([place])   . CHF (congestive heart failure) ([place])   . Hypertension     Patient Active Problem List   Diagnosis Date Noted  . Encephalopathy acute 05/11/2012  . Acute respiratory failure with hypoxia ([place]) 05/11/2012  . Cardiomyopathy ([place]) 05/11/2012  . Systolic CHF, acute ([place]) 05/11/2012  . Hypotension 05/11/2012  . Acute renal failure ([place]) 05/11/2012  . Acute alcoholic hepatitis  11/91/4782  . Thrombocytosis ([place]) 05/11/2012  . Hyponatremia 05/11/2012  . Pneumonia 05/11/2012    History reviewed. No pertinent surgical history.      Home Medications    Prior to Admission medications   Medication Sig Start Date End Date Taking? Authorizing Provider  acetaminophen (TYLENOL) 500 MG tablet Take 1,000 mg by mouth every 6 (six) hours as needed for pain or fever.    [provider]  Alum & Mag Hydroxide-Simeth (MAGIC MOUTHWASH) SOLN Take 5 mLs by mouth 3 (three) times daily as needed for mouth pain. 04/28/15   Palumbo, April, MD  amiodarone (PACERONE) 200 MG tablet Take 1 tablet (200 mg total) by mouth daily. 05/17/12 04/28/15  Dixie Dials, MD  amLODipine (NORVASC) 5 MG tablet Take 5 mg by mouth at bedtime.    [provider]  clindamycin (CLEOCIN) 300 MG capsule Take 1 capsule (300 mg total) by mouth 4 (four) times daily. X 7 days 04/28/15   Palumbo, April, MD  digoxin (LANOXIN) 0.0625 mg TABS Take 0.5 tablets (0.0625 mg total) by mouth daily. 05/17/12   Dixie Dials, MD  guaiFENesin (MUCINEX) 600 MG 12 hr tablet Take by mouth 2 (two) times daily.     [provider]  meloxicam (MOBIC) 7.5 MG tablet Take 1 tablet (7.5 mg total) by mouth daily. 04/28/15   Palumbo, April, MD  metoprolol tartrate (LOPRESSOR) 25 MG tablet Take 1  tablet (25 mg total) by mouth 2 (two) times daily. Patient taking differently: Take 12.5 mg by mouth 2 (two) times daily.  05/17/12 04/28/15  Dixie Dials, MD  Multiple Vitamin (MULTIVITAMIN WITH MINERALS) TABS tablet Take 1 tablet by mouth daily.    [provider]  warfarin (COUMADIN) 4 MG tablet Take 4-6 mg by mouth one time only at 6 PM. Take one tablet on Sunday, Tuesday, Thursday and Saturday. Take 1 and one-half tablet on Monday, Wednesday, and Friday. 05/17/12 04/28/15  Dixie Dials, MD    Family History No family history on file.  Social History Social History   Tobacco Use  . Smoking status: Current  Every Day Smoker    Packs/day: 0.25    Years: 10.00    Pack years: 2.50  . Smokeless tobacco: Never Used  Substance Use Topics  . Alcohol use: Yes    Alcohol/week: 2.0 - 3.0 standard drinks    Types: 2 - 3 Standard drinks or equivalent per week    Comment: occ  . Drug use: Yes    Types: Marijuana     Allergies   Patient has no known allergies.   Review of Systems Review of Systems  Constitutional: Negative for chills and fever.  HENT: Negative for congestion, rhinorrhea and sore throat.   Eyes: Negative for visual disturbance.  Respiratory: Positive for cough and shortness of breath. Negative for chest tightness and wheezing.   Cardiovascular: Negative for chest pain, palpitations and leg swelling.  Gastrointestinal: Negative for abdominal pain, blood in stool, constipation, diarrhea, nausea and vomiting.  Genitourinary: Negative for dysuria and frequency.  Musculoskeletal: Negative for arthralgias, back pain, joint swelling and myalgias.  Skin: Positive for pallor. Negative for color change and rash.  Neurological: Negative for dizziness, syncope, light-headedness and numbness.     Physical Exam Updated Vital Signs BP 130/69   Pulse 74   Temp 97.8 F (36.6 C) (Oral)   Resp 15   Ht 6\' 3"  (1.905 m)   Wt 66.2 kg   SpO2 98%   BMI 18.25 kg/m   Physical Exam Vitals signs and nursing note reviewed.  Constitutional:      General: He is not in acute distress.    Appearance: He is well-developed and normal weight. He is not ill-appearing, toxic-appearing or diaphoretic.  HENT:     Head: Normocephalic and atraumatic.     Mouth/Throat:     Mouth: Mucous membranes are moist.     Pharynx: Oropharynx is clear.  Eyes:     General:        Right eye: No discharge.        Left eye: No discharge.     Comments: Slight scleral icterus  Neck:     Musculoskeletal: Neck supple.     Trachea: No tracheal deviation.  Cardiovascular:     Rate and Rhythm: Normal rate and regular  rhythm.     Pulses: Normal pulses.          Radial pulses are 2+ on the right side and 2+ on the left side.       Dorsalis pedis pulses are 2+ on the right side and 2+ on the left side.     Heart sounds: Normal heart sounds. No murmur. No friction rub. No gallop.   Pulmonary:     Effort: Pulmonary effort is normal. No respiratory distress.     Breath sounds: Normal breath sounds.     Comments: Respirations equal and unlabored, patient able  to speak in full sentences, lungs clear to auscultation bilaterally Chest:     Chest wall: No tenderness.  Abdominal:     General: Bowel sounds are normal.     Palpations: Abdomen is soft. There is no mass.     Tenderness: There is no abdominal tenderness. There is no guarding or rebound.     Comments: Abdomen soft, nondistended, nontender to palpation in all quadrants without guarding or peritoneal signs  Musculoskeletal:     Right lower leg: He exhibits no tenderness. No edema.     Left lower leg: He exhibits no tenderness. No edema.  Skin:    General: Skin is warm and dry.     Coloration: Skin is pale.     Comments: Patient appears slightly pale, and a bit jaundiced  Neurological:     Mental Status: He is alert and oriented to person, place, and time. Mental status is at baseline.     Coordination: Coordination normal.  Psychiatric:        Mood and Affect: Mood normal.        Behavior: Behavior normal.      ED Treatments / Results  Labs (all labs ordered are listed, but only abnormal results are displayed) Labs Reviewed  CBC WITH DIFFERENTIAL/PLATELET - Abnormal; Notable for the following components:      Result Value   WBC 16.1 (*)    RBC 1.96 (*)    Hemoglobin 2.9 (*)    HCT 12.2 (*)    MCV 62.2 (*)    MCH 14.8 (*)    MCHC 23.8 (*)    RDW 25.2 (*)    Platelets 540 (*)    nRBC 2.9 (*)    Neutro Abs 12.1 (*)    Monocytes Absolute 1.9 (*)    Basophils Absolute 0.2 (*)    nRBC 9 (*)    All other components within normal limits    BRAIN NATRIURETIC PEPTIDE - Abnormal; Notable for the following components:   B Natriuretic Peptide 726.2 (*)    All other components within normal limits  COMPREHENSIVE METABOLIC PANEL - Abnormal; Notable for the following components:   Sodium 123 (*)    Chloride 90 (*)    Glucose, Bld 117 (*)    Creatinine, Ser 1.54 (*)    Calcium 8.5 (*)    Albumin 2.7 (*)    AST 86 (*)    ALT 48 (*)    Total Bilirubin 2.7 (*)    GFR calc non Af Amer 56 (*)    All other components within normal limits  CBC WITH DIFFERENTIAL/PLATELET - Abnormal; Notable for the following components:   WBC 14.6 (*)    RBC 1.78 (*)    Hemoglobin 2.7 (*)    HCT 11.3 (*)    MCV 63.5 (*)    MCH 15.2 (*)    MCHC 23.9 (*)    RDW 24.9 (*)    Platelets 502 (*)    Neutro Abs 10.4 (*)    Monocytes Absolute 2.5 (*)    All other components within normal limits  BRAIN NATRIURETIC PEPTIDE - Abnormal; Notable for the following components:   B Natriuretic Peptide 723.7 (*)    All other components within normal limits  COMPREHENSIVE METABOLIC PANEL - Abnormal; Notable for the following components:   Sodium 123 (*)    Chloride 90 (*)    Glucose, Bld 112 (*)    Creatinine, Ser 1.39 (*)    Calcium 8.2 (*)  Albumin 2.6 (*)    AST 80 (*)    Total Bilirubin 2.7 (*)    All other components within normal limits  PROTIME-INR - Abnormal; Notable for the following components:   Prothrombin Time 84.4 (*)    INR >10.00 (*)    All other components within normal limits  VITAMIN B12 - Abnormal; Notable for the following components:   Vitamin B-12 2,342 (*)    All other components within normal limits  IRON AND TIBC - Abnormal; Notable for the following components:   Iron 11 (*)    TIBC 535 (*)    Saturation Ratios 2 (*)    All other components within normal limits  FERRITIN - Abnormal; Notable for the following components:   Ferritin 18 (*)    All other components within normal limits  RETICULOCYTES - Abnormal; Notable  for the following components:   RBC. 1.79 (*)    Immature Retic Fract 18.3 (*)    All other components within normal limits  I-STAT CHEM 8, ED - Abnormal; Notable for the following components:   Sodium 125 (*)    Chloride 90 (*)    Creatinine, Ser 1.50 (*)    Glucose, Bld 110 (*)    Calcium, Ion 1.09 (*)    Hemoglobin 5.1 (*)    HCT 15.0 (*)    All other components within normal limits  FOLATE  HIV ANTIBODY (ROUTINE TESTING W REFLEX)  PROTIME-INR  LACTATE DEHYDROGENASE  TRANSFERRIN  HAPTOGLOBIN  DIGOXIN LEVEL  HEMOGLOBIN AND HEMATOCRIT, BLOOD  HEMOGLOBIN AND HEMATOCRIT, BLOOD  HEMOGLOBIN AND HEMATOCRIT, BLOOD  OCCULT BLOOD X 1 CARD TO LAB, STOOL  I-STAT TROPONIN, ED  I-STAT CHEM 8, ED  I-STAT TROPONIN, ED  POC OCCULT BLOOD, ED  PREPARE RBC (CROSSMATCH)  TYPE AND SCREEN  ABO/RH  PREPARE RBC (CROSSMATCH)  PREPARE FRESH FROZEN PLASMA    EKG None  Radiology Ct Abdomen Pelvis Wo Contrast  Result Date: 11/22/2018 CLINICAL DATA:  37 year old male with generalized weakness. INR greater than 10 and anemia with decreased hemoglobin. Query retroperitoneal hemorrhage. EXAM: CT ABDOMEN AND PELVIS WITHOUT CONTRAST TECHNIQUE: Multidetector CT imaging of the abdomen and pelvis was performed following the standard protocol without IV contrast. COMPARISON:  Lumbar radiographs 09/14/2006. FINDINGS: Lower chest: Cardiomegaly. No pericardial effusion. Small or trace bilateral layering pleural effusions, but otherwise negative lung bases. Hepatobiliary: Hyperdensity of the liver parenchyma such as can be seen with chronic amiodarone. There is mild gallbladder wall thickening or pericholecystic fluid, but no associated pericholecystic fat stranding. Pancreas: Negative noncontrast pancreas. Spleen: Negative. Adrenals/Urinary Tract: Moderate bilateral pararenal space and perinephric stranding appears nonspecific. No nephrolithiasis. No hydronephrosis. Negative adrenal glands. Retroperitoneal  stranding bilaterally appears symmetric. There is no discrete retroperitoneal hematoma. Unremarkable urinary bladder. Stomach/Bowel: Negative descending and rectosigmoid colon. Transverse colon is mildly redundant. Negative right colon, appendix (coronal image 66) and terminal ileum. No dilated small bowel. There is a linear 15 millimeter hyperdense object in the left lower quadrant which may be in a small bowel loop and on coronal images somewhat resembles dentition (series 6, image 75). The stomach is decompressed. No free air. There is mild generalized mesenteric stranding throughout the abdomen, but no discrete abdominal free fluid. Vascular/Lymphatic: Occasional calcified atherosclerosis. Vascular patency is not evaluated in the absence of IV contrast. No lymphadenopathy. Reproductive: Negative. Other: Small volume of free fluid in the pelvis but with simple fluid density (series 3, image 71). Superimposed mild to moderate nonspecific presacral stranding. Musculoskeletal: No acute osseous  abnormality identified. Bone mineralization is within normal limits. Chronic L5-S1 disc degeneration with vacuum disc. IMPRESSION: 1. Negative for retroperitoneal or other discrete hematoma to explain anemia. 2. Positive for cardiomegaly with trace pleural effusions and small volume of simple appearing pelvic free fluid. 3. Generalized abdominal mesenteric and pararenal stranding is nonspecific, as is mild gallbladder wall edema or pericholecystic fluid. This might be related to anasarca, but there is a lack of generalized subcutaneous edema. 4. Hyperdensity of the liver parenchyma such as can be seen with chronic amiodarone therapy. Query liver insufficiency which might also explain #3. 5. A 15 mm hyperdense object in the left abdominal small bowel is nonspecific but somewhat resembles a tooth. Is there any evidence of recent tooth loss and accidental ingestion. Electronically Signed   By: Genevie Ann M.D.   On: 11/22/2018 16:17    Dg Chest 2 View  Result Date: 11/22/2018 CLINICAL DATA:  Cough and shortness of breath EXAM: CHEST - 2 VIEW COMPARISON:  November 09, 2018 FINDINGS: There is no edema or consolidation. There is cardiomegaly with a degree of pulmonary venous hypertension. No adenopathy. No bone lesions. IMPRESSION: Pulmonary vascular congestion without edema or consolidation. No adenopathy evident. Electronically Signed   By: Lowella Grip III M.D.   On: 11/22/2018 09:37    Procedures .Critical Care Performed by: Jacqlyn Larsen, PA-C Authorized by: Jacqlyn Larsen, PA-C   Critical care provider statement:    Critical care time (minutes):  45   Critical care time was exclusive of:  Separately billable procedures and treating other patients   Critical care was necessary to treat or prevent imminent or life-threatening deterioration of the following conditions:  Circulatory failure (Profound anemia, with hemoglobin of 2.7)   Critical care was time spent personally by me on the following activities:  Discussions with consultants, evaluation of patient's response to treatment, examination of patient, ordering and performing treatments and interventions, ordering and review of laboratory studies, ordering and review of radiographic studies, pulse oximetry, re-evaluation of patient's condition, obtaining history from patient or surrogate and review of old charts   (including critical care time)  Medications Ordered in ED Medications  0.9 %  sodium chloride infusion (Manually program via Guardrails IV Fluids) ( Intravenous Stopped 11/22/18 1259)     Initial Impression / Assessment and Plan / ED Course  I have reviewed the triage vital signs and the nursing notes.  Pertinent labs & imaging results that were available during my care of the patient were reviewed by me and considered in my medical decision making (see chart for details).  Patient presents to the emergency department for evaluation of  shortness of breath which is been progressively worsening over the past week, has known history of heart failure and was seen earlier in the month with similar symptoms and diagnosed with mild fluid overload, had his home Lasix dose increased with some improvement, he denies any significant lower extremity swelling but reports he started to feel very short of breath again.  He also reports generalized fatigue.  No associated chest pain.  Will get basic labs, chest x-ray, EKG, troponin and BNP.  I have extremely low suspicion for PE given the patient is on warfarin for A. fib and is compliant with this medication.  Suspect patient may have mild CHF exacerbation, he is also reported some productive cough, could also be pneumonia.  11:08 AM notified by nursing of hemoglobin of 2.9 on CBC, most recent comparison available is from 2013, unfortunately  when patient was recently seen in the emergency department earlier this week, CBC appears to have never resulted.  Will repeat labs to ensure accuracy given significantly low hemoglobin.  Patient denies any acute bleeding symptoms, no blood in the stool.  He is not having any abdominal or back pain.  Continues to deny any chest pain, not having any significant respiratory distress or shortness of breath while here in the ED.  Labs otherwise show a leukocytosis of 14.6, sodium of 125, on review patient does have history of chronic hyponatremia.  Creatinine of 1.5 which appears to be at patient's baseline, no other acute electrolyte derangements requiring intervention.  LFTs minimally elevated, and bilirubin of 2.7 which is chronic per chart review.  Patient does have mild jaundice, has history of alcohol abuse and continues to drink at least 2 beers daily, and I suspect this is the cause of this, if patient was having acute hemolytic reaction would expect significantly elevated bilirubin.  Repeat CBC again demonstrates hemoglobin of 2.7.  I suspect that this is likely  been a chronic process otherwise I feel that patient will be much more symptomatic in the setting of that level of acute blood loss.  Will send anemia panel and check PT/INR, type and screen.  Troponin negative and EKG without concerning ischemic changes.  BNP is elevated at 723.7.  Chest x-ray shows mild pulmonary vascular congestion without edema or consolidation.  INR is greater than 10, adjusting warfarin is extremely supratherapeutic, but again patient has no evidence of acute bleeding.  Will begin transfusion, but patient will require admission given extremely low hemoglobin for continued transfusion and further evaluation of significant anemia.  Case discussed with Dr. Tally Due with [facility] hospitalist.  Who requests CT abdomen pelvis without contrast to rule out retroperitoneal hemorrhage.  She will plan to transfusion additional unit will also give FFP and vitamin K given INR level.  We will plan to admit to stepdown unit.  Despite significant anemia patient remains very stable.  Case discussed with Dr. Regenia Skeeter who is seated evaluated patient as well and is in agreement with plan.  Final Clinical Impressions(s) / ED Diagnoses   Final diagnoses:  Symptomatic anemia  Shortness of breath    ED Discharge Orders    None       Jacqlyn Larsen, [place] 11/22/18 1731    Sherwood Gambler, MD 11/25/18 2135

## 2018-11-22 NOTE — ED Triage Notes (Addendum)
Pt reports he was here at the beginning of the month and told he had some fluid around his heart causing leg swelling. Pt reports that he is now having sob with walking and a cough, reports he takes 40mg  lasix bid. Hx of afib and chf-reports sometimes he only takes lasix once/day. Pt a/ox4, resp e/u.

## 2018-11-22 NOTE — Progress Notes (Signed)
   BP/HR stable -  And hgb improving . Per ER doc an trh - chronic anemia  Recent Labs  Lab 11/22/18 0922 11/22/18 1135 11/22/18 1139  HGB 2.9* 2.7* 5.1*  HCT 12.2* 11.3* 15.0*  WBC 16.1* 14.6*  --   PLT 540* 502*  --    Per RN at 2W -> refused patient despite hgb improving  Plan  - [facility] MD admit to ICU under [facility] service  - serial monitoring labs per [facility] with suport from ICU RN and elink  - if decompensates [facility] to call [facility]MM for take over     SIGNATURE    Dr. Brand Males, M.D., F.C.C.P,  Pulmonary and Critical Care Medicine Staff Physician, [place] Director - Interstitial Lung Disease  Program  [facility] [place], [place], [address]  Pager: [phone], If no answer or between  15:00h - 7:00h: call [phone] Telephone: [phone]  3:56 PM 11/22/2018

## 2018-11-22 NOTE — Consult Note (Signed)
Reason for Consult: Congestive heart failure Referring Physician: [facility] hospital[facility]  Matthew Joseph is an 37 y.o. male.  HPI: Patient is 37 year old male with past medical history significant for hypertension, paroxysmal atrial fibrillation on chronic anticoagulation, history of systolic congestive heart failure in the past, EtOH abuse, polysubstance abuse, history of alcoholic hepatitis, came to the ED complaining of progressive exertional dyspnea with minimal exertion associated with leg swelling for last 1 week.  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  Denies any chest pain.  Denies PND orthopnea but complains of leg swelling.  Denies palpitation lightheadedness or syncope.  In the ED patient was noted to have microcytic hypochromic anemia with hemoglobin of 5.1.  His stool test has been reported negative.  Patient denies any abdominal pain denies any bright red blood per rectum or melena.  Denies hemoptysis.  States recently saw PMD and was noted to have gained 10 to 15 pounds and was started on Lasix.  EKG done in the ED showed poor R wave progression with ST-T wave changes in inferolateral leads which were similar as compared to prior EKG.  Past Medical History:  Diagnosis Date  . Atrial fibrillation ([place])   . CHF (congestive heart failure) ([place])   . Hypertension     History reviewed. No pertinent surgical history.  No family history on file.  Social History:  reports that he has been smoking. He has a 2.50 pack-year smoking history. He has never used smokeless tobacco. He reports current alcohol use of about 2.0 - 3.0 standard drinks of alcohol per week. He reports current drug use. Drug: Marijuana.  Allergies: No Known Allergies  Medications: I have reviewed the patient's current medications.  Results for orders placed or performed during the hospital encounter of 11/22/18 (from the past 48 hour(s))  CBC with Differential     Status: Abnormal   Collection Time: 11/22/18   9:22 AM  Result Value Ref Range   WBC 16.1 (H) 4.0 - 10.5 K/uL   RBC 1.96 (L) 4.22 - 5.81 MIL/uL   Hemoglobin 2.9 (LL) 13.0 - 17.0 g/dL    Comment: REPEATED TO VERIFY Reticulocyte Hemoglobin testing may be clinically indicated, consider ordering this additional test [id number] THIS CRITICAL RESULT HAS VERIFIED AND BEEN CALLED TO T VANKORTSHMER,RN BY KIM COLVIN ON 12 23 2019 AT 1106, AND HAS BEEN READ BACK.     HCT 12.2 (L) 39.0 - 52.0 %   MCV 62.2 (L) 80.0 - 100.0 fL   MCH 14.8 (L) 26.0 - 34.0 pg   MCHC 23.8 (L) 30.0 - 36.0 g/dL   RDW 25.2 (H) 11.5 - 15.5 %   Platelets 540 (H) 150 - 400 K/uL    Comment: REPEATED TO VERIFY   nRBC 2.9 (H) 0.0 - 0.2 %   Neutrophils Relative % 75 %   Neutro Abs 12.1 (H) 1.7 - 7.7 K/uL   Lymphocytes Relative 12 %   Lymphs Abs 1.9 0.7 - 4.0 K/uL   Monocytes Relative 12 %   Monocytes Absolute 1.9 (H) 0.1 - 1.0 K/uL   Eosinophils Relative 0 %   Eosinophils Absolute 0.0 0.0 - 0.5 K/uL   Basophils Relative 1 %   Basophils Absolute 0.2 (H) 0.0 - 0.1 K/uL   nRBC 9 (H) 0 /100 WBC   Abs Immature Granulocytes 0.00 0.00 - 0.07 K/uL   Tear Drop Cells PRESENT    Polychromasia PRESENT    Target Cells PRESENT    Stomatocytes PRESENT  Comment: Performed at [facility], [place] [address], [place], [place] [address]  Brain natriuretic peptide     Status: Abnormal   Collection Time: 11/22/18  9:22 AM  Result Value Ref Range   B Natriuretic Peptide 726.2 (H) 0.0 - 100.0 pg/mL    Comment: Performed at [facility] [address], [place], [place] [address]  Comprehensive metabolic panel     Status: Abnormal   Collection Time: 11/22/18  9:22 AM  Result Value Ref Range   Sodium 123 (L) 135 - 145 mmol/L   Potassium 4.4 3.5 - 5.1 mmol/L   Chloride 90 (L) 98 - 111 mmol/L   CO2 22 22 - 32 mmol/L   Glucose, Bld 117 (H) 70 - 99 mg/dL   BUN 18 6 - 20 mg/dL   Creatinine, Ser 1.54 (H) 0.61 - 1.24 mg/dL   Calcium 8.5 (L) 8.9 - 10.3 mg/dL   Total Protein  8.1 6.5 - 8.1 g/dL   Albumin 2.7 (L) 3.5 - 5.0 g/dL   AST 86 (H) 15 - 41 U/L   ALT 48 (H) 0 - 44 U/L   Alkaline Phosphatase 68 38 - 126 U/L   Total Bilirubin 2.7 (H) 0.3 - 1.2 mg/dL   GFR calc non Af Amer 56 (L) >60 mL/min   GFR calc Af Amer >60 >60 mL/min   Anion gap 11 5 - 15    Comment: Performed at [facility], [place] [address], [place], [place] [address]  I-stat troponin, ED     Status: None   Collection Time: 11/22/18  9:24 AM  Result Value Ref Range   Troponin i, poc 0.02 0.00 - 0.08 ng/mL   Comment 3            Comment: Due to the release kinetics of cTnI, a negative result within the first hours of the onset of symptoms does not rule out myocardial infarction with certainty. If myocardial infarction is still suspected, repeat the test at appropriate intervals.   Type and screen     Status: None (Preliminary result)   Collection Time: 11/22/18 11:30 AM  Result Value Ref Range   ABO/RH(D) B POS    Antibody Screen NEG    Sample Expiration 11/25/2018    Unit Number [id number]    Blood Component Type RED CELLS,LR    Unit division 00    Status of Unit ALLOCATED    Transfusion Status OK TO TRANSFUSE    Crossmatch Result Compatible    Unit Number [id number]    Blood Component Type RED CELLS,LR    Unit division 00    Status of Unit      ISSUED Performed at [facility], [place] [address] [place], [place] [address]    Transfusion Status OK TO TRANSFUSE    Crossmatch Result Compatible    Unit Number [id number]    Blood Component Type RED CELLS,LR    Unit division 00    Status of Unit ALLOCATED    Transfusion Status OK TO TRANSFUSE    Crossmatch Result Compatible   ABO/Rh     Status: None   Collection Time: 11/22/18 11:30 AM  Result Value Ref Range   ABO/RH(D) B POS   CBC with Differential     Status: Abnormal   Collection Time: 11/22/18 11:35 AM  Result Value Ref Range   WBC 14.6 (H) 4.0 - 10.5 K/uL   RBC 1.78 (L) 4.22 - 5.81 MIL/uL  Hemoglobin 2.7 (LL) 13.0 - 17.0 g/dL    Comment: REPEATED TO VERIFY CRITICAL VALUE NOTED.  VALUE IS CONSISTENT WITH PREVIOUSLY REPORTED AND CALLED VALUE.    HCT 11.3 (L) 39.0 - 52.0 %   MCV 63.5 (L) 80.0 - 100.0 fL   MCH 15.2 (L) 26.0 - 34.0 pg   MCHC 23.9 (L) 30.0 - 36.0 g/dL   RDW 24.9 (H) 11.5 - 15.5 %   Platelets 502 (H) 150 - 400 K/uL   Neutrophils Relative % 71 %   Neutro Abs 10.4 (H) 1.7 - 7.7 K/uL   Lymphocytes Relative 11 %   Lymphs Abs 1.5 0.7 - 4.0 K/uL   Monocytes Relative 17 %   Monocytes Absolute 2.5 (H) 0.1 - 1.0 K/uL   Eosinophils Relative 1 %   Eosinophils Absolute 0.1 0.0 - 0.5 K/uL   Basophils Relative 0 %   Basophils Absolute 0.0 0.0 - 0.1 K/uL   RBC Morphology TEARDROP CELLS     Comment: BURR CELLS Schistocytes present Performed at [facility], [address] [place], [place] [address]   Comprehensive metabolic panel     Status: Abnormal   Collection Time: 11/22/18 11:35 AM  Result Value Ref Range   Sodium 123 (L) 135 - 145 mmol/L   Potassium 4.2 3.5 - 5.1 mmol/L   Chloride 90 (L) 98 - 111 mmol/L   CO2 22 22 - 32 mmol/L   Glucose, Bld 112 (H) 70 - 99 mg/dL   BUN 19 6 - 20 mg/dL   Creatinine, Ser 1.39 (H) 0.61 - 1.24 mg/dL   Calcium 8.2 (L) 8.9 - 10.3 mg/dL   Total Protein 7.6 6.5 - 8.1 g/dL   Albumin 2.6 (L) 3.5 - 5.0 g/dL   AST 80 (H) 15 - 41 U/L   ALT 43 0 - 44 U/L   Alkaline Phosphatase 65 38 - 126 U/L   Total Bilirubin 2.7 (H) 0.3 - 1.2 mg/dL   GFR calc non Af Amer >60 >60 mL/min   GFR calc Af Amer >60 >60 mL/min   Anion gap 11 5 - 15    Comment: Performed at [facility], [place] [address], [place], [place] [address]  Protime-INR     Status: Abnormal   Collection Time: 11/22/18 11:35 AM  Result Value Ref Range   Prothrombin Time 84.4 (H) 11.4 - 15.2 seconds   INR >10.00 (HH)     Comment: REPEATED TO VERIFY CRITICAL RESULT CALLED TO, READ BACK BY AND VERIFIED WITH: M.STANHOOK,RN 11/22/18 1231 DAVISB Performed at [facility], [place] [address] [place], [place] [address]   Brain natriuretic peptide     Status: Abnormal   Collection Time: 11/22/18 11:36 AM  Result Value Ref Range   B Natriuretic Peptide 723.7 (H) 0.0 - 100.0 pg/mL    Comment: Performed at [facility] [address] [place], [place] [address]  I-Stat Troponin, ED (not at [facility])     Status: None   Collection Time: 11/22/18 11:38 AM  Result Value Ref Range   Troponin i, poc 0.01 0.00 - 0.08 ng/mL   Comment 3            Comment: Due to the release kinetics of cTnI, a negative result within the first hours of the onset of symptoms does not rule out myocardial infarction with certainty. If myocardial infarction is still suspected, repeat the test at appropriate intervals.   I-Stat Chem 8, ED  Status: Abnormal   Collection Time: 11/22/18 11:39 AM  Result Value Ref Range   Sodium 125 (L) 135 - 145 mmol/L   Potassium 4.2 3.5 - 5.1 mmol/L   Chloride 90 (L) 98 - 111 mmol/L   BUN 19 6 - 20 mg/dL   Creatinine, Ser 1.50 (H) 0.61 - 1.24 mg/dL   Glucose, Bld 110 (H) 70 - 99 mg/dL   Calcium, Ion 1.09 (L) 1.15 - 1.40 mmol/L   TCO2 25 22 - 32 mmol/L   Hemoglobin 5.1 (LL) 13.0 - 17.0 g/dL   HCT 15.0 (L) 39.0 - 52.0 %   Comment NOTIFIED PHYSICIAN   Prepare RBC     Status: None   Collection Time: 11/22/18 11:50 AM  Result Value Ref Range   Order Confirmation ORDER PROCESSED BY BLOOD BANK   POC occult blood, ED Provider will collect     Status: None   Collection Time: 11/22/18 12:04 PM  Result Value Ref Range   Fecal Occult Bld NEGATIVE NEGATIVE  Vitamin B12     Status: Abnormal   Collection Time: 11/22/18  1:09 PM  Result Value Ref Range   Vitamin B-12 2,342 (H) 180 - 914 pg/mL    Comment: (NOTE) This assay is not validated for testing neonatal or myeloproliferative syndrome specimens for Vitamin B12 levels. Performed at [facility], [place] [address], [place], [place] [address]   Iron and TIBC     Status: Abnormal    Collection Time: 11/22/18  1:09 PM  Result Value Ref Range   Iron 11 (L) 45 - 182 ug/dL   TIBC 535 (H) 250 - 450 ug/dL   Saturation Ratios 2 (L) 17.9 - 39.5 %   UIBC 524 ug/dL    Comment: Performed at [facility] [address], [place], [place] [address]  Ferritin     Status: Abnormal   Collection Time: 11/22/18  1:09 PM  Result Value Ref Range   Ferritin 18 (L) 24 - 336 ng/mL    Comment: Performed at [facility], [place] [address] [place], [place] [address]  Reticulocytes     Status: Abnormal   Collection Time: 11/22/18  1:09 PM  Result Value Ref Range   Retic Ct Pct 2.3 0.4 - 3.1 %   RBC. 1.79 (L) 4.22 - 5.81 MIL/uL   Retic Count, Absolute 41.9 19.0 - 186.0 K/uL   Immature Retic Fract 18.3 (H) 2.3 - 15.9 %    Comment: Performed at [facility] [address], [place], [place] [address]  Folate     Status: None   Collection Time: 11/22/18  1:09 PM  Result Value Ref Range   Folate 29.0 >5.9 ng/mL    Comment: Performed at [facility], [place] [address], [place], [place] [address]  Prepare RBC     Status: None   Collection Time: 11/22/18  2:19 PM  Result Value Ref Range   Order Confirmation      ORDER PROCESSED BY BLOOD BANK Performed at [facility], [place] [address], [place], [place] [address]   Prepare fresh frozen plasma     Status: None (Preliminary result)   Collection Time: 11/22/18  2:19 PM  Result Value Ref Range   Unit Number [id number]    Blood Component Type THW PLS APHR    Unit division 00    Status of Unit ALLOCATED    Transfusion Status OK TO TRANSFUSE     Ct  Abdomen Pelvis Wo Contrast  Result Date: 11/22/2018 CLINICAL DATA:  37 year old male with generalized weakness. INR greater than 10 and anemia with decreased hemoglobin. Query retroperitoneal hemorrhage. EXAM: CT ABDOMEN AND PELVIS WITHOUT CONTRAST TECHNIQUE: Multidetector CT imaging of the abdomen and pelvis was performed following the standard protocol without IV  contrast. COMPARISON:  Lumbar radiographs 09/14/2006. FINDINGS: Lower chest: Cardiomegaly. No pericardial effusion. Small or trace bilateral layering pleural effusions, but otherwise negative lung bases. Hepatobiliary: Hyperdensity of the liver parenchyma such as can be seen with chronic amiodarone. There is mild gallbladder wall thickening or pericholecystic fluid, but no associated pericholecystic fat stranding. Pancreas: Negative noncontrast pancreas. Spleen: Negative. Adrenals/Urinary Tract: Moderate bilateral pararenal space and perinephric stranding appears nonspecific. No nephrolithiasis. No hydronephrosis. Negative adrenal glands. Retroperitoneal stranding bilaterally appears symmetric. There is no discrete retroperitoneal hematoma. Unremarkable urinary bladder. Stomach/Bowel: Negative descending and rectosigmoid colon. Transverse colon is mildly redundant. Negative right colon, appendix (coronal image 66) and terminal ileum. No dilated small bowel. There is a linear 15 millimeter hyperdense object in the left lower quadrant which may be in a small bowel loop and on coronal images somewhat resembles dentition (series 6, image 75). The stomach is decompressed. No free air. There is mild generalized mesenteric stranding throughout the abdomen, but no discrete abdominal free fluid. Vascular/Lymphatic: Occasional calcified atherosclerosis. Vascular patency is not evaluated in the absence of IV contrast. No lymphadenopathy. Reproductive: Negative. Other: Small volume of free fluid in the pelvis but with simple fluid density (series 3, image 71). Superimposed mild to moderate nonspecific presacral stranding. Musculoskeletal: No acute osseous abnormality identified. Bone mineralization is within normal limits. Chronic L5-S1 disc degeneration with vacuum disc. IMPRESSION: 1. Negative for retroperitoneal or other discrete hematoma to explain anemia. 2. Positive for cardiomegaly with trace pleural effusions and small  volume of simple appearing pelvic free fluid. 3. Generalized abdominal mesenteric and pararenal stranding is nonspecific, as is mild gallbladder wall edema or pericholecystic fluid. This might be related to anasarca, but there is a lack of generalized subcutaneous edema. 4. Hyperdensity of the liver parenchyma such as can be seen with chronic amiodarone therapy. Query liver insufficiency which might also explain #3. 5. A 15 mm hyperdense object in the left abdominal small bowel is nonspecific but somewhat resembles a tooth. Is there any evidence of recent tooth loss and accidental ingestion. Electronically Signed   By: Genevie Ann M.D.   On: 11/22/2018 16:17   Dg Chest 2 View  Result Date: 11/22/2018 CLINICAL DATA:  Cough and shortness of breath EXAM: CHEST - 2 VIEW COMPARISON:  November 09, 2018 FINDINGS: There is no edema or consolidation. There is cardiomegaly with a degree of pulmonary venous hypertension. No adenopathy. No bone lesions. IMPRESSION: Pulmonary vascular congestion without edema or consolidation. No adenopathy evident. Electronically Signed   By: Lowella Grip III M.D.   On: 11/22/2018 09:37    Review of Systems  Constitutional: Positive for malaise/fatigue.  HENT: Negative for hearing loss.   Eyes: Negative for blurred vision.  Respiratory: Positive for shortness of breath. Negative for cough.   Cardiovascular: Positive for leg swelling. Negative for chest pain and palpitations.  Gastrointestinal: Negative for abdominal pain, nausea and vomiting.  Genitourinary: Negative for dysuria and flank pain.  Skin: Negative for itching and rash.  Neurological: Negative for dizziness.   Blood pressure 123/67, pulse 69, temperature 98.7 F (37.1 C), temperature source Oral, resp. rate (!) 23, height 6\' 3"  (1.905 m), weight 65.5 kg, SpO2 96 %. Physical  Exam  Constitutional: He is oriented to person, place, and time.  HENT:  Head: Normocephalic and atraumatic.  Eyes: Left eye exhibits  no discharge. Scleral icterus is present.  Conjunctiva pale  Neck: No JVD present. No tracheal deviation present. No thyromegaly present.  Cardiovascular: Normal rate and regular rhythm.  Murmur (2/6 systolic murmur and soft S3 gallop noted) heard. Respiratory: No respiratory distress.  Decreased breath sound at bases with faint rales noted  GI: Soft. Bowel sounds are normal. He exhibits no distension. There is no rebound.  Musculoskeletal:     Comments: No clubbing cyanosis 1+ edema noted  Neurological: He is alert and oriented to person, place, and time.    Assessment/Plan: Decompensated systolic congestive heart failure precipitated by anemia Severe symptomatic microcytic hypochromic anemia rule out GI loss Hypertension History of paroxysmal atrial fibrillation on chronic anticoagulation Questionable ischemic/nonischemic dilated cardiomyopathy Alcoholic liver disease Polysubstance abuse Plan Agree with present management Hold amiodarone for now in view of elevated LFTs Add low-dose losartan Anemia work-up may need endoscopy Agree with packed RBC blood transfusion and IV Lasix Follow serial H&H Recheck stool for occult blood We will check old records Check 2D echo  Charolette Forward 11/22/2018, 5:46 PM

## 2018-11-22 NOTE — ED Notes (Signed)
Patient transported to X-ray 

## 2018-11-22 NOTE — H&P (Addendum)
History and Physical    DOA: 11/22/2018  PCP: Dixie Dials, MD  Patient coming from: Home  Chief Complaint: Exertional dyspnea for 1-1/2-week  HPI: Matthew Joseph is a 37 y.o. male with history h/o hypertension, paroxysmal A. 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  Patient was evaluated by his PCP on December 5 for leg swellings and his Lasix dosage was increased from 20 mg to 40 mg twice daily.  Patient was then evaluated in the ED here on December 10 for complaints of leg swellings at which time he reported noncompliance with twice daily Lasix intake.  He was given 1 dose of IV Lasix in the ED and discharged with instructions to resume 40 mg p.o. twice daily.  Lab work during that visit revealed creatinine of 1.2 and sodium of 124.  CBC or PT/INR was not obtained during that visit.  Work-up in the ED today revealed severe anemia and coagulopathy with CBC showing hemoglobin at 5.1 (initially resulted as 2.9 and rechecked) and PT/INR significantly abnormal with PT at 84 and INR greater than 10.  BMP revealed sodium of 124 and creatinine of 1.5.  Chest x-ray showed mild pulmonary vascular congestion and BNP elevated at 720 (was at 500 previously) patient denies any melena or hematochezia or epistaxis or other signs of gross bleeding.  He reports dark urine but denies any history of hematuria.  He denies any chest pain but reports palpitations on exertion which is not unusual for him with history of A. fib.  Patient noticed to be slightly jaundiced during physical exam and mother bedside reports noticing that lately but patient unaware.  He denies any chest pain or abdominal pain.  Fecal occult blood testing negative in the ER.  Mother brings up family  history of aortic dissections and 2 paternal uncles, 1 of them apparently passed away last night due to complications of hypertensive emergencies/aortic dissection. Patient's blood pressure is in systolic 017C and he denies any chest pain or abdominal pain.  He denies any cocaine abuse but admits to marijuana use last night.  Drinks 2 beers per day, last drink yesterday.  He believes he underwent echocardiogram and Coumadin level checked 2 months back through his cardiologist office but results unavailable in our system. Patient receiving first unit of blood transfusion.    Review of Systems: As per HPI otherwise 10 point review of systems negative.    Past Medical History:  Diagnosis Date  . Atrial fibrillation ([place])   . CHF (congestive heart failure) ([place])   . Hypertension     History reviewed. No pertinent surgical history.  Social history:  reports that he has been smoking. He has a 2.50 pack-year smoking history. He has never used smokeless tobacco. He reports current alcohol use of about 2.0 - 3.0 standard drinks of alcohol per week. He reports current drug use. Drug: Marijuana.   No Known Allergies  Family history: History of aortic dissection and fatal complications in 2 paternal uncles as described in HPI.  History of diabetes on mother's side of the family.   Prior to Admission medications   Medication Sig Start Date End Date Taking? Authorizing Provider  acetaminophen (TYLENOL) 500 MG tablet Take 1,000 mg by mouth every 6 (six) hours as needed  for pain or fever.   Yes [provider]  amiodarone (PACERONE) 200 MG tablet Take 1 tablet (200 mg total) by mouth daily. 05/17/12 11/22/18 Yes Dixie Dials, MD  amLODipine (NORVASC) 5 MG tablet Take 5 mg by mouth daily.    Yes [provider]  digoxin (LANOXIN) 0.0625 mg TABS Take 0.5 tablets (0.0625 mg total) by mouth daily. 05/17/12  Yes Dixie Dials, MD  furosemide (LASIX) 40 MG tablet Take 40 mg by mouth 2  (two) times daily. 11/04/18  Yes [provider]  guaiFENesin (MUCINEX) 600 MG 12 hr tablet Take 600 mg by mouth 2 (two) times daily as needed for cough or to loosen phlegm.    Yes [provider]  metoprolol tartrate (LOPRESSOR) 25 MG tablet Take 1 tablet (25 mg total) by mouth 2 (two) times daily. 05/17/12 11/22/18 Yes Dixie Dials, MD  Multiple Vitamin (MULTIVITAMIN WITH MINERALS) TABS tablet Take 1 tablet by mouth daily.    Yes [provider]  potassium chloride (K-DUR) 10 MEQ tablet Take 10 mEq by mouth daily. 11/04/18  Yes [provider]  warfarin (COUMADIN) 4 MG tablet Take 4-6 mg by mouth See admin instructions. Take one tablet on Sunday, Tuesday, Thursday and Saturday. Take 1 and one-half tablet on Monday, Wednesday, and Friday.  05/17/12 11/22/18 Yes Dixie Dials, MD  Alum & Mag Hydroxide-Simeth (MAGIC MOUTHWASH) SOLN Take 5 mLs by mouth 3 (three) times daily as needed for mouth pain. Patient not taking: Reported on 11/22/2018 04/28/15   Palumbo, April, MD  meloxicam (MOBIC) 7.5 MG tablet Take 1 tablet (7.5 mg total) by mouth daily. Patient not taking: Reported on 11/22/2018 04/28/15   Veatrice Kells, MD    Physical Exam: Vitals:   11/22/18 1300 11/22/18 1314 11/22/18 1330 11/22/18 1345  BP: 116/61 114/62 114/63 121/70  Pulse: 68 67 67 68  Resp: 18 18 15 15   Temp:  98 F (36.7 C)    TempSrc:  Oral    SpO2: 100% 100% 100% 100%  Weight:      Height:        Constitutional: NAD, calm, comfortable, not in any respiratory distress.  Mother bedside Eyes: PERRL, lids and conjunctivae mildly icteric ENMT: Mucous membranes are moist. Posterior pharynx clear of any exudate or lesions.Normal dentition.  Neck: normal, supple, no masses, no thyromegaly Respiratory: clear to auscultation bilaterally, no wheezing, no crackles. Normal respiratory effort. No accessory muscle use.  Cardiovascular: Regular rate and rhythm, no murmurs / rubs / gallops.  2+  pitting lower extremity edema. 2+ pedal pulses. No carotid bruits.  Abdomen: no tenderness, no masses palpated. No hepatosplenomegaly. Bowel sounds positive.  Musculoskeletal: no clubbing / cyanosis. No joint deformity upper and lower extremities. Good ROM, no contractures. Normal muscle tone.  Neurologic: CN 2-12 grossly intact. Sensation intact, DTR normal. Strength 4/5 in all 4.  Psychiatric: Normal judgment and insight. Alert and oriented x 3. Normal mood.  SKIN/catheters: no rashes, lesions, ulcers. No induration  Labs on Admission: I have personally reviewed following labs and imaging studies  CBC: Recent Labs  Lab 11/22/18 0922 11/22/18 1135 11/22/18 1139  WBC 16.1* 14.6*  --   NEUTROABS 12.1* 10.4*  --   HGB 2.9* 2.7* 5.1*  HCT 12.2* 11.3* 15.0*  MCV 62.2* 63.5*  --   PLT 540* 502*  --    Basic Metabolic Panel: Recent Labs  Lab 11/22/18 0922 11/22/18 1135 11/22/18 1139  NA 123* 123* 125*  K 4.4 4.2 4.2  CL 90* 90* 90*  CO2 22 22  --   GLUCOSE 117* 112* 110*  BUN 18 19 19   CREATININE 1.54* 1.39* 1.50*  CALCIUM 8.5* 8.2*  --    GFR: Estimated Creatinine Clearance: 61.9 mL/min (A) (by C-G formula based on SCr of 1.5 mg/dL (H)). Liver Function Tests: Recent Labs  Lab 11/22/18 0922 11/22/18 1135  AST 86* 80*  ALT 48* 43  ALKPHOS 68 65  BILITOT 2.7* 2.7*  PROT 8.1 7.6  ALBUMIN 2.7* 2.6*   No results for input(s): LIPASE, AMYLASE in the last 168 hours. No results for input(s): AMMONIA in the last 168 hours. Coagulation Profile: Recent Labs  Lab 11/22/18 1135  INR >10.00*   Cardiac Enzymes: No results for input(s): CKTOTAL, CKMB, CKMBINDEX, TROPONINI in the last 168 hours. BNP (last 3 results) No results for input(s): PROBNP in the last 8760 hours. HbA1C: No results for input(s): HGBA1C in the last 72 hours. CBG: No results for input(s): GLUCAP in the last 168 hours. Lipid Profile: No results for input(s): CHOL, HDL, LDLCALC, TRIG, CHOLHDL,  LDLDIRECT in the last 72 hours. Thyroid Function Tests: No results for input(s): TSH, T4TOTAL, FREET4, T3FREE, THYROIDAB in the last 72 hours. Anemia Panel: Recent Labs    11/22/18 1309  RETICCTPCT 2.3   Urine analysis:    Component Value Date/Time   COLORURINE AMBER (A) 05/08/2012 2237   APPEARANCEUR CLOUDY (A) 05/08/2012 2237   LABSPEC 1.020 05/08/2012 2237   PHURINE 5.5 05/08/2012 2237   GLUCOSEU NEGATIVE 05/08/2012 2237   HGBUR NEGATIVE 05/08/2012 2237   BILIRUBINUR MODERATE (A) 05/08/2012 2237   KETONESUR NEGATIVE 05/08/2012 2237   PROTEINUR NEGATIVE 05/08/2012 2237   UROBILINOGEN 1.0 05/08/2012 2237   NITRITE NEGATIVE 05/08/2012 2237   LEUKOCYTESUR NEGATIVE 05/08/2012 2237    Radiological Exams on Admission: Dg Chest 2 View  Result Date: 11/22/2018 CLINICAL DATA:  Cough and shortness of breath EXAM: CHEST - 2 VIEW COMPARISON:  November 09, 2018 FINDINGS: There is no edema or consolidation. There is cardiomegaly with a degree of pulmonary venous hypertension. No adenopathy. No bone lesions. IMPRESSION: Pulmonary vascular congestion without edema or consolidation. No adenopathy evident. Electronically Signed   By: Lowella Grip III M.D.   On: 11/22/2018 09:37    EKG: Independently reviewed.  Prolonged PR interval and low voltage with nonspecific ST changes in the lateral leads likely related to anemia/demand ischemia     Assessment and Plan:   1.  Severe symptomatic anemia: Exertional dyspnea and ongoing leg swellings for a month could be related to anemia in addition to underlying CHF.  Unclear etiology.  No obvious signs of bleeding, rectal guaiac-negative in the ED.  CT abdomen/pelvis to rule out retroperitoneal hematoma pending at this time but patient denies any back pain.  Although mother concerned about family history of dissection, patient denies any complaints of chest pain/abdominal pain and creatinine elevated at 1.5.  Discussed with patient, mother, ED  physician Dr. Regenia Skeeter: Feel dissection is unlikely in this patient (with equal blood pressures on bilateral arms/no evidence of mediastinal widening on chest x-ray) and contrast risk might outweigh benefit at this point.  Will pursue CT without contrast which should pick up any gross bleeding.  Also ordered echo given low voltage to rule out pericardial effusion.  We will transfuse 2 units PRBC and reverse Coumadin with vitamin K and 1 unit FFP.  Will contact primary cardiology office to obtain prior records/lab results.  Lasix will be given between transfusions.  Although patient mildly icteric, total bilirubin only slightly elevated from baseline around 2.2.  He does have history of alcoholic hepatitis and continues to drink.  Doubt hemolysis but will obtain LDH level.Peripheral smear did not report schistocytes.  Daily stool occult blood x2 ordered.  If positive will need endoscopic work-up  2.  Leg swelling/acute on chronic systolic CHF: Repeat echo ordered.  Patient reports taking Lasix only 40 mg daily most of the times and occasionally would take twice daily as prescribed.  Will admit with IV Lasix twice daily and additional dosing between transfusions.  3.  Paroxysmal atrial fibrillation: Currently in normal sinus rhythm.  Hold warfarin.  INR greater than 10.  FFP/vitamin K as described above.  Resume beta-blockers/amiodarone and digoxin.  4.  Alcohol/marijuana abuse: Counseled patient regarding risks and encouraged to quit.  He may benefit from substance abuse consult at some point  5.Hypertension: Resume beta-blockers.  Hold Norvasc given leg swellings.  Patient also reports noncompliance with this medication  DVT prophylaxis: Coagulopathic on Coumadin  Code Status: Full code  Family Communication: Discussed with patient and mother bedside. Health care proxy would be his mother Consults called: Cardiology Dr. Wardell Honour Admission status:  Patient admitted as inpatient to stepdown  status given severity of illness and anticipated LOS greater than 2 midnights.  Addendum: Nursing staff uncomfortable with patient being admitted to stepdown.  The requested ICU admission.  Discussed with PCCM Dr. Chase Caller who recommended ICU admission under hospitalist service but they will follow on serial H&H results and clinical status through [facility].  Will transfer service if any acute decompensation noted. -Of note, patient also reported later that he did have mouth/gum bleeding over the last 4 to 5 months which she did not feel was significant amount to report.  He stated he has had a bad tooth for a while and was scheduled to see a dentist.  Will monitor serial hemoglobins. -CT abdomen/pelvis without contrast to rule out retroperitoneal hematoma or other signs of internal bleeding but did report interestingly a foreign body which appears to be a swallowed tooth in the small bowel.  Guilford Shi MD [facility] Pager [phone]  If 7PM-7AM, please contact night-coverage [email or web address] Password [email or web address]  11/22/2018, 2:15 PM

## 2018-11-22 NOTE — ED Notes (Signed)
Patient transported to CT 

## 2018-11-23 ENCOUNTER — Inpatient Hospital Stay (HOSPITAL_COMMUNITY): Payer: Self-pay

## 2018-11-23 DIAGNOSIS — D689 Coagulation defect, unspecified: Secondary | ICD-10-CM

## 2018-11-23 DIAGNOSIS — E871 Hypo-osmolality and hyponatremia: Secondary | ICD-10-CM

## 2018-11-23 DIAGNOSIS — F1010 Alcohol abuse, uncomplicated: Secondary | ICD-10-CM

## 2018-11-23 DIAGNOSIS — I5021 Acute systolic (congestive) heart failure: Secondary | ICD-10-CM

## 2018-11-23 LAB — COMPREHENSIVE METABOLIC PANEL
ALT: 44 U/L (ref 0–44)
ALT: 43 U/L (ref 0–44)
AST: 82 U/L — ABNORMAL HIGH (ref 15–41)
AST: 82 U/L — ABNORMAL HIGH (ref 15–41)
Albumin: 2.5 g/dL — ABNORMAL LOW (ref 3.5–5.0)
Albumin: 2.4 g/dL — ABNORMAL LOW (ref 3.5–5.0)
Alkaline Phosphatase: 66 U/L (ref 38–126)
Alkaline Phosphatase: 71 U/L (ref 38–126)
Anion gap: 11 (ref 5–15)
Anion gap: 9 (ref 5–15)
BUN: 10 mg/dL (ref 6–20)
BUN: 10 mg/dL (ref 6–20)
CHLORIDE: 96 mmol/L — AB (ref 98–111)
CO2: 24 mmol/L (ref 22–32)
CO2: 26 mmol/L (ref 22–32)
Calcium: 8.2 mg/dL — ABNORMAL LOW (ref 8.9–10.3)
Calcium: 8.1 mg/dL — ABNORMAL LOW (ref 8.9–10.3)
Chloride: 96 mmol/L — ABNORMAL LOW (ref 98–111)
Creatinine, Ser: 0.96 mg/dL (ref 0.61–1.24)
Creatinine, Ser: 0.98 mg/dL (ref 0.61–1.24)
GFR calc Af Amer: >60 mL/min (ref >60)
GFR calc Af Amer: >60 mL/min (ref >60)
GFR calc non Af Amer: >60 mL/min (ref >60)
GFR calc non Af Amer: >60 mL/min (ref >60)
Glucose, Bld: 112 mg/dL — ABNORMAL HIGH (ref 70–99)
Glucose, Bld: 118 mg/dL — ABNORMAL HIGH (ref 70–99)
POTASSIUM: 3.6 mmol/L (ref 3.5–5.1)
POTASSIUM: 3.8 mmol/L (ref 3.5–5.1)
Sodium: 131 mmol/L — ABNORMAL LOW (ref 135–145)
Sodium: 131 mmol/L — ABNORMAL LOW (ref 135–145)
Total Bilirubin: 3.7 mg/dL — ABNORMAL HIGH (ref 0.3–1.2)
Total Bilirubin: 3.6 mg/dL — ABNORMAL HIGH (ref 0.3–1.2)
Total Protein: 7.3 g/dL (ref 6.5–8.1)
Total Protein: 7.3 g/dL (ref 6.5–8.1)

## 2018-11-23 LAB — CBC WITH DIFFERENTIAL/PLATELET
Abs Immature Granulocytes: 0 10*3/uL (ref 0.00–0.07)
Basophils Absolute: 0.1 10*3/uL (ref 0.0–0.1)
Basophils Relative: 1 %
Eosinophils Absolute: 0.1 10*3/uL (ref 0.0–0.5)
Eosinophils Relative: 1 %
HCT: 27.8 % — ABNORMAL LOW (ref 39.0–52.0)
Hemoglobin: 8.5 g/dL — ABNORMAL LOW (ref 13.0–17.0)
Lymphocytes Relative: 7 %
Lymphs Abs: 1 10*3/uL (ref 0.7–4.0)
MCH: 24.1 pg — ABNORMAL LOW (ref 26.0–34.0)
MCHC: 30.6 g/dL (ref 30.0–36.0)
MCV: 79 fL — ABNORMAL LOW (ref 80.0–100.0)
Monocytes Absolute: 1 10*3/uL (ref 0.1–1.0)
Monocytes Relative: 7 %
NEUTROS ABS: 11.9 10*3/uL — AB (ref 1.7–7.7)
NEUTROS PCT: 84 %
Platelets: 421 10*3/uL — ABNORMAL HIGH (ref 150–400)
RBC: 3.52 MIL/uL — ABNORMAL LOW (ref 4.22–5.81)
RDW: 27.6 % — AB (ref 11.5–15.5)
WBC: 14.2 10*3/uL — ABNORMAL HIGH (ref 4.0–10.5)
nRBC: 1.6 % — ABNORMAL HIGH (ref 0.0–0.2)
nRBC: 2 /100 WBC — ABNORMAL HIGH

## 2018-11-23 LAB — PREPARE FRESH FROZEN PLASMA: Unit division: 0

## 2018-11-23 LAB — PROTIME-INR
INR: 5.47
INR: 4.98
Prothrombin Time: 48.9 seconds — ABNORMAL HIGH (ref 11.4–15.2)
Prothrombin Time: 45.5 seconds — ABNORMAL HIGH (ref 11.4–15.2)

## 2018-11-23 LAB — PREPARE RBC (CROSSMATCH)

## 2018-11-23 LAB — HEMOGLOBIN AND HEMATOCRIT, BLOOD
HEMATOCRIT: 19.4 % — AB (ref 39.0–52.0)
Hemoglobin: 5.8 g/dL — CL (ref 13.0–17.0)

## 2018-11-23 LAB — BPAM FFP
Blood Product Expiration Date: 201912282359
ISSUE DATE / TIME: 201912231749
Unit Type and Rh: 7300

## 2018-11-23 LAB — HIV ANTIBODY (ROUTINE TESTING W REFLEX): HIV SCREEN 4TH GENERATION: NONREACTIVE

## 2018-11-23 LAB — LACTATE DEHYDROGENASE: LDH: 215 U/L — ABNORMAL HIGH (ref 98–192)

## 2018-11-23 LAB — TRANSFERRIN: Transferrin: 369 mg/dL — ABNORMAL HIGH (ref 180–329)

## 2018-11-23 LAB — ECHOCARDIOGRAM COMPLETE
Height: 75 in
Weight: 2308.8 oz

## 2018-11-23 LAB — DIGOXIN LEVEL: Digoxin Level: 0.5 ng/mL — ABNORMAL LOW (ref 0.8–2.0)

## 2018-11-23 MED ORDER — SODIUM CHLORIDE 0.9% IV SOLUTION: Freq: Once | INTRAVENOUS | Status: DC

## 2018-11-23 MED ORDER — PHYTONADIONE 5 MG PO TABS
2.5000 mg | ORAL_TABLET | Freq: Once | ORAL | Status: AC
  Administered 2018-11-23: 2.5 mg via ORAL
  Filled 2018-11-23: qty 1

## 2018-11-23 MED ORDER — SODIUM CHLORIDE 0.9 % IV SOLN
510.0000 mg | Freq: Once | INTRAVENOUS | Status: AC
  Administered 2018-11-23: 510 mg via INTRAVENOUS
  Filled 2018-11-23: qty 17

## 2018-11-23 NOTE — Progress Notes (Signed)
  Echocardiogram 2D Echocardiogram has been performed.  Kayonna Lawniczak T Armilda Vanderlinden 11/23/2018, 12:15 PM

## 2018-11-23 NOTE — Progress Notes (Signed)
CRITICAL VALUE ALERT  Critical Value:  INR 5.47  Hb 5.8  Ptt 48.9   Date & Time Notied:  11/23/2018  0026  Provider Notified: Dr. Elsworth Soho  Orders Received/Actions taken: Awaiting new orders

## 2018-11-23 NOTE — Consult Note (Addendum)
Referring Provider:  Dr. Wynelle Cleveland, [facility] Primary Care Physician:  Dixie Dials, MD Primary Gastroenterologist:  Althia Forts  Reason for Consultation:  Anemia and black stools  HPI: Matthew Joseph is a 37 y.o. male with history of hypertension, paroxysmal A. 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  Work-up in the ED today revealed severe anemia and coagulopathy with CBC showing hemoglobin at 2.7 grams and PT/INR significantly abnormal with PT at 84 and INR greater than 10.  BMP revealed sodium of 124 and creatinine of 1.5.  Chest x-ray showed mild pulmonary vascular congestion and BNP elevated at 720 (was at 500 previously.  Patient was heme negative.  Patient denies any melena or hematochezia or epistaxis or other signs of gross bleeding.  Says that his gums bleed on and off but just small amounts.  He says that on and off for the past few weeks his stools have been "darker than normal" but NOT black.  He denies really any GI complaints.  Moves his bowels well, no abdominal pain, heartburn/reflux, weight loss.  Denies NSAID use.  As stated, INR was > 10 and this AM it is 5.47.  Hgb was 2.7 grams and this AM it is 5.8 grams after 2 units PRBC's.  Iron studies are low.  He was actually hemoccult negative.  Received 2 more units PRBC's and Hgb is up to 8.5 grams.  CT abdomen and pelvis without contrast showed the following:  IMPRESSION: 1. Negative for retroperitoneal or other discrete hematoma to explain anemia. 2. Positive for cardiomegaly with trace pleural effusions and small volume of simple appearing pelvic free fluid. 3. Generalized abdominal mesenteric and pararenal stranding is nonspecific, as is mild gallbladder wall edema or pericholecystic fluid. This might be related to  anasarca, but there is a lack of generalized subcutaneous edema. 4. Hyperdensity of the liver parenchyma such as can be seen with chronic amiodarone therapy. Query liver insufficiency which might also explain #3. 5. A 15 mm hyperdense object in the left abdominal small bowel is nonspecific but somewhat resembles a tooth. Is there any evidence of recent tooth loss and accidental ingestion.  Also has mildly elevated LFT's with total bili 3.7 and AST 82.  Drinks ETOH although I did not press the issue on how much he drinks.   Past Medical History:  Diagnosis Date  . Atrial fibrillation ([place])   . CHF (congestive heart failure) ([place])   . Hypertension    History reviewed. No pertinent surgical history.  Prior to Admission medications   Medication Sig Start Date End Date Taking? Authorizing Provider  acetaminophen (TYLENOL) 500 MG tablet Take 1,000 mg by mouth every 6 (six) hours as needed for pain or fever.   Yes [provider]  amiodarone (PACERONE) 200 MG tablet Take 1 tablet (200 mg total) by mouth daily. 05/17/12 11/22/18 Yes Dixie Dials, MD  amLODipine (NORVASC) 5 MG tablet Take 5 mg by mouth daily.    Yes [provider]  digoxin (LANOXIN) 0.0625 mg TABS Take 0.5 tablets (0.0625 mg total) by mouth daily. 05/17/12  Yes Dixie Dials, MD  furosemide (LASIX) 40 MG tablet Take 40 mg by mouth 2 (two) times daily. 11/04/18  Yes [provider]  guaiFENesin (MUCINEX) 600 MG 12 hr tablet Take 600 mg by mouth 2 (two) times daily as needed  for cough or to loosen phlegm.    Yes [provider]  metoprolol tartrate (LOPRESSOR) 25 MG tablet Take 1 tablet (25 mg total) by mouth 2 (two) times daily. 05/17/12 11/22/18 Yes Dixie Dials, MD  Multiple Vitamin (MULTIVITAMIN WITH MINERALS) TABS tablet Take 1 tablet by mouth daily.    Yes [provider]  potassium chloride (K-DUR) 10 MEQ tablet Take 10 mEq by mouth daily. 11/04/18  Yes [provider]    warfarin (COUMADIN) 4 MG tablet Take 4-6 mg by mouth See admin instructions. Take one tablet on Sunday, Tuesday, Thursday and Saturday. Take 1 and one-half tablet on Monday, Wednesday, and Friday.  05/17/12 11/22/18 Yes Dixie Dials, MD  Alum & Mag Hydroxide-Simeth (MAGIC MOUTHWASH) SOLN Take 5 mLs by mouth 3 (three) times daily as needed for mouth pain. Patient not taking: Reported on 11/22/2018 04/28/15   Palumbo, April, MD  meloxicam (MOBIC) 7.5 MG tablet Take 1 tablet (7.5 mg total) by mouth daily. Patient not taking: Reported on 11/22/2018 04/28/15   Randal Buba, April, MD    Current Facility-Administered Medications  Medication Dose Route Frequency Provider Last Rate Last Dose  . 0.9 %  sodium chloride infusion (Manually program via Guardrails IV Fluids)   Intravenous Once Guilford Shi, MD      . 0.9 %  sodium chloride infusion (Manually program via Guardrails IV Fluids)   Intravenous Once Schorr, Rhetta Mura, NP      . acetaminophen (TYLENOL) tablet 500 mg  500 mg Oral Q6H PRN Guilford Shi, MD      . chlorhexidine (PERIDEX) 0.12 % solution 15 mL  15 mL Mouth Rinse BID Mannam, Praveen, MD   15 mL at 11/23/18 1016  . digoxin (LANOXIN) tablet 0.0625 mg  0.0625 mg Oral Daily Kamineni, Lamount Cranker, MD   0.0625 mg at 11/23/18 1017  . furosemide (LASIX) injection 20 mg  20 mg Intravenous Once Guilford Shi, MD      . losartan (COZAAR) tablet 25 mg  25 mg Oral Daily Charolette Forward, MD   Stopped at 11/22/18 1807  . MEDLINE mouth rinse  15 mL Mouth Rinse q12n4p Mannam, Praveen, MD      . metoprolol tartrate (LOPRESSOR) tablet 25 mg  25 mg Oral BID Guilford Shi, MD   Stopped at 11/23/18 1012  . ondansetron (ZOFRAN) tablet 4 mg  4 mg Oral Q6H PRN Guilford Shi, MD       Or  . ondansetron (ZOFRAN) injection 4 mg  4 mg Intravenous Q6H PRN Kamineni, Neelima, MD      . potassium chloride (K-DUR,KLOR-CON) CR tablet 10 mEq  10 mEq Oral Daily Guilford Shi, MD   10 mEq at 11/23/18 1018     Allergies as of 11/22/2018  . (No Known Allergies)    No family history on file.  Social History   Socioeconomic History  . Marital status: Single    Spouse name: Not on file  . Number of children: Not on file  . Years of education: Not on file  . Highest education level: Not on file  Occupational History  . Not on file  Social Needs  . Financial resource strain: Not on file  . Food insecurity:    Worry: Not on file    Inability: Not on file  . Transportation needs:    Medical: Not on file    Non-medical: Not on file  Tobacco Use  . Smoking status: Current Every Day Smoker    Packs/day: 0.25  Years: 10.00    Pack years: 2.50  . Smokeless tobacco: Never Used  Substance and Sexual Activity  . Alcohol use: Yes    Alcohol/week: 2.0 - 3.0 standard drinks    Types: 2 - 3 Standard drinks or equivalent per week    Comment: occ  . Drug use: Yes    Types: Marijuana  . Sexual activity: Not on file  Lifestyle  . Physical activity:    Days per week: Not on file    Minutes per session: Not on file  . Stress: Not on file  Relationships  . Social connections:    Talks on phone: Not on file    Gets together: Not on file    Attends religious service: Not on file    Active member of club or organization: Not on file    Attends meetings of clubs or organizations: Not on file    Relationship status: Not on file  . Intimate partner violence:    Fear of current or ex partner: Not on file    Emotionally abused: Not on file    Physically abused: Not on file    Forced sexual activity: Not on file  Other Topics Concern  . Not on file  Social History Narrative  . Not on file    Review of Systems: ROS is negative except as mentioned in HPI.  Physical Exam: Vital signs in last 24 hours: Temp:  [98 F (36.7 C)-98.7 F (37.1 C)] 98.5 F (36.9 C) (12/24 0730) Pulse Rate:  [57-69] 62 (12/24 1030) Resp:  [12-23] 16 (12/24 1030) BP: (107-126)/(56-77) 117/68 (12/24  1030) SpO2:  [85 %-100 %] 100 % (12/24 1030) Weight:  [65.5 kg] 65.5 kg (12/23 1715) Last BM Date: 11/21/18 General:  Alert, Well-developed, well-nourished, pleasant and cooperative in NAD Head:  Normocephalic and atraumatic. Eyes:  Sclera clear, no icterus.  Conjunctiva pink. Ears:  Normal auditory acuity. Mouth:  No deformity or lesions.   Lungs:  Clear throughout to auscultation.  No wheezes, crackles, or rhonchi.  Heart:  Regular rate and rhythm; no murmurs, clicks, rubs, or gallops. Abdomen:  Soft, non-distended.  BS present.  Non-tender. Rectal:  Deferred.  Was heme negative. Msk:  Symmetrical without gross deformities. Pulses:  Normal pulses noted. Extremities:  Without clubbing or edema. Neurologic:  Alert and oriented x 4;  grossly normal neurologically. Skin:  Intact without significant lesions or rashes. Psych:  Alert and cooperative. Normal mood and affect.  Intake/Output from previous day: 12/23 0701 - 12/24 0700 In: 970 [P.O.:120; I.V.:20; Blood:830] Out: 1480 [Urine:1480] Intake/Output this shift: Total I/O In: 830 [P.O.:480; Blood:350] Out: 800 [Urine:800]  Lab Results: Recent Labs    11/22/18 0922 11/22/18 1135 11/22/18 1139 11/23/18 0026  WBC 16.1* 14.6*  --   --   HGB 2.9* 2.7* 5.1* 5.8*  HCT 12.2* 11.3* 15.0* 19.4*  PLT 540* 502*  --   --    BMET Recent Labs    11/22/18 0922 11/22/18 1135 11/22/18 1139  NA 123* 123* 125*  K 4.4 4.2 4.2  CL 90* 90* 90*  CO2 22 22  --   GLUCOSE 117* 112* 110*  BUN 18 19 19   CREATININE 1.54* 1.39* 1.50*  CALCIUM 8.5* 8.2*  --    LFT Recent Labs    11/22/18 1135  PROT 7.6  ALBUMIN 2.6*  AST 80*  ALT 43  ALKPHOS 65  BILITOT 2.7*   PT/INR Recent Labs    11/22/18 1135 11/23/18 0026  LABPROT 84.4* 48.9*  INR >10.00* 5.47*   Studies/Results: Ct Abdomen Pelvis Wo Contrast  Result Date: 11/22/2018 CLINICAL DATA:  37 year old male with generalized weakness. INR greater than 10 and anemia with  decreased hemoglobin. Query retroperitoneal hemorrhage. EXAM: CT ABDOMEN AND PELVIS WITHOUT CONTRAST TECHNIQUE: Multidetector CT imaging of the abdomen and pelvis was performed following the standard protocol without IV contrast. COMPARISON:  Lumbar radiographs 09/14/2006. FINDINGS: Lower chest: Cardiomegaly. No pericardial effusion. Small or trace bilateral layering pleural effusions, but otherwise negative lung bases. Hepatobiliary: Hyperdensity of the liver parenchyma such as can be seen with chronic amiodarone. There is mild gallbladder wall thickening or pericholecystic fluid, but no associated pericholecystic fat stranding. Pancreas: Negative noncontrast pancreas. Spleen: Negative. Adrenals/Urinary Tract: Moderate bilateral pararenal space and perinephric stranding appears nonspecific. No nephrolithiasis. No hydronephrosis. Negative adrenal glands. Retroperitoneal stranding bilaterally appears symmetric. There is no discrete retroperitoneal hematoma. Unremarkable urinary bladder. Stomach/Bowel: Negative descending and rectosigmoid colon. Transverse colon is mildly redundant. Negative right colon, appendix (coronal image 66) and terminal ileum. No dilated small bowel. There is a linear 15 millimeter hyperdense object in the left lower quadrant which may be in a small bowel loop and on coronal images somewhat resembles dentition (series 6, image 75). The stomach is decompressed. No free air. There is mild generalized mesenteric stranding throughout the abdomen, but no discrete abdominal free fluid. Vascular/Lymphatic: Occasional calcified atherosclerosis. Vascular patency is not evaluated in the absence of IV contrast. No lymphadenopathy. Reproductive: Negative. Other: Small volume of free fluid in the pelvis but with simple fluid density (series 3, image 71). Superimposed mild to moderate nonspecific presacral stranding. Musculoskeletal: No acute osseous abnormality identified. Bone mineralization is within  normal limits. Chronic L5-S1 disc degeneration with vacuum disc. IMPRESSION: 1. Negative for retroperitoneal or other discrete hematoma to explain anemia. 2. Positive for cardiomegaly with trace pleural effusions and small volume of simple appearing pelvic free fluid. 3. Generalized abdominal mesenteric and pararenal stranding is nonspecific, as is mild gallbladder wall edema or pericholecystic fluid. This might be related to anasarca, but there is a lack of generalized subcutaneous edema. 4. Hyperdensity of the liver parenchyma such as can be seen with chronic amiodarone therapy. Query liver insufficiency which might also explain #3. 5. A 15 mm hyperdense object in the left abdominal small bowel is nonspecific but somewhat resembles a tooth. Is there any evidence of recent tooth loss and accidental ingestion. Electronically Signed   By: Genevie Ann M.D.   On: 11/22/2018 16:17   Dg Chest 2 View  Result Date: 11/22/2018 CLINICAL DATA:  Cough and shortness of breath EXAM: CHEST - 2 VIEW COMPARISON:  November 09, 2018 FINDINGS: There is no edema or consolidation. There is cardiomegaly with a degree of pulmonary venous hypertension. No adenopathy. No bone lesions. IMPRESSION: Pulmonary vascular congestion without edema or consolidation. No adenopathy evident. Electronically Signed   By: Lowella Grip III M.D.   On: 11/22/2018 09:37   IMPRESSION:  *Iron deficiency anemia:  Hgb 2.7 grams on admission.  Iron studies quite low.  Heme negative but reports intermittent "darker" but not "black" stools over the past few weeks.  Increased to 8.5 grams after 4 units PRBC's. *Coumadin coagulopathy:  INR >10 on admission.  INR had not been checked in about a year.  Down to about 5.5 today after some reversal. *Hyperdensity of the liver parenchyma such as can be seen with chronic amiodarone therapy.  Also has mildly elevated LFT's with total bili 3.7 and AST 82.  Drinks ETOH although I did not press the issue on how much  he drinks.  Hard to determine which of these is causing these abnormalities by imaging.   PLAN: *Deserves EGD at some point.  If he is still here on Thursday then will plan for then.  He is asking that if he is otherwise ready for discharge tomorrow if he could follow-up for EGD as outpatient and promises that he would proceed.  I think that is ok as he's had not active bleeding and is heme negative.  Would then need to have that arranged through our office in the next couple of weeks. *Monitor Hgb and for signs of bleeding. *Will check viral hepatitis studies as well.   Matthew Joseph. Matthew Joseph  11/23/2018, 10:52 AM

## 2018-11-23 NOTE — Progress Notes (Signed)
Patient transferred from 38M via [place] to 63m15. Patient is alert oriented and stable. Dorthey Sawyer, RN

## 2018-11-23 NOTE — Progress Notes (Signed)
Subjective:  Patient denies any chest pain or shortness of breath.  States feels stronger after packed RBCs transfusion.  2-D echo showed mildly depressed LV systolic function with moderate MR and severe tricuspid regurgitation .tolerating ARB  renal function has improved. Etiology of chronic anemia, unclear, workup in progress per primary team Objective:  Vital Signs in the last 24 hours: Temp:  [98 F (36.7 C)-98.7 F (37.1 C)] 98.5 F (36.9 C) (12/24 0730) Pulse Rate:  [57-69] 62 (12/24 1030) Resp:  [12-23] 16 (12/24 1030) BP: (107-126)/(56-77) 117/68 (12/24 1030) SpO2:  [85 %-100 %] 100 % (12/24 1030) Weight:  [65.5 kg] 65.5 kg (12/23 1715)  Intake/Output from previous day: 12/23 0701 - 12/24 0700 In: 970 [P.O.:120; I.V.:20; Blood:830] Out: 1480 [Urine:1480] Intake/Output from this shift: Total I/O In: 830 [P.O.:480; Blood:350] Out: 800 [Urine:800]  Physical Exam: Neck: no adenopathy, no carotid bruit, no JVD and supple, symmetrical, trachea midline Lungs: clear to auscultation bilaterally Heart: regular rate and rhythm, S1, S2 normal and 2/6 systolic murmur noted Abdomen: soft, non-tender; bowel sounds normal; no masses,  no organomegaly Extremities: no clubbing, cyanosis.  Trace edema noted  Lab Results: Recent Labs    11/22/18 1135  11/23/18 0026 11/23/18 1118  WBC 14.6*  --   --  14.2*  HGB 2.7*   < > 5.8* 8.5*  PLT 502*  --   --  421*   < > = values in this interval not displayed.   Recent Labs    11/22/18 1135 11/22/18 1139 11/23/18 1118  NA 123* 125* 131*  K 4.2 4.2 3.6  CL 90* 90* 96*  CO2 22  --  24  GLUCOSE 112* 110* 112*  BUN 19 19 10   CREATININE 1.39* 1.50* 0.96   No results for input(s): TROPONINI in the last 72 hours.  Invalid input(s): CK, MB Hepatic Function Panel Recent Labs    11/23/18 1118  PROT 7.3  ALBUMIN 2.5*  AST 82*  ALT 44  ALKPHOS 66  BILITOT 3.7*   No results for input(s): CHOL in the last 72 hours. No results for  input(s): PROTIME in the last 72 hours.  Imaging: Imaging results have been reviewed and Ct Abdomen Pelvis Wo Contrast  Result Date: 11/22/2018 CLINICAL DATA:  37 year old male with generalized weakness. INR greater than 10 and anemia with decreased hemoglobin. Query retroperitoneal hemorrhage. EXAM: CT ABDOMEN AND PELVIS WITHOUT CONTRAST TECHNIQUE: Multidetector CT imaging of the abdomen and pelvis was performed following the standard protocol without IV contrast. COMPARISON:  Lumbar radiographs 09/14/2006. FINDINGS: Lower chest: Cardiomegaly. No pericardial effusion. Small or trace bilateral layering pleural effusions, but otherwise negative lung bases. Hepatobiliary: Hyperdensity of the liver parenchyma such as can be seen with chronic amiodarone. There is mild gallbladder wall thickening or pericholecystic fluid, but no associated pericholecystic fat stranding. Pancreas: Negative noncontrast pancreas. Spleen: Negative. Adrenals/Urinary Tract: Moderate bilateral pararenal space and perinephric stranding appears nonspecific. No nephrolithiasis. No hydronephrosis. Negative adrenal glands. Retroperitoneal stranding bilaterally appears symmetric. There is no discrete retroperitoneal hematoma. Unremarkable urinary bladder. Stomach/Bowel: Negative descending and rectosigmoid colon. Transverse colon is mildly redundant. Negative right colon, appendix (coronal image 66) and terminal ileum. No dilated small bowel. There is a linear 15 millimeter hyperdense object in the left lower quadrant which may be in a small bowel loop and on coronal images somewhat resembles dentition (series 6, image 75). The stomach is decompressed. No free air. There is mild generalized mesenteric stranding throughout the abdomen, but no discrete abdominal free  fluid. Vascular/Lymphatic: Occasional calcified atherosclerosis. Vascular patency is not evaluated in the absence of IV contrast. No lymphadenopathy. Reproductive: Negative. Other:  Small volume of free fluid in the pelvis but with simple fluid density (series 3, image 71). Superimposed mild to moderate nonspecific presacral stranding. Musculoskeletal: No acute osseous abnormality identified. Bone mineralization is within normal limits. Chronic L5-S1 disc degeneration with vacuum disc. IMPRESSION: 1. Negative for retroperitoneal or other discrete hematoma to explain anemia. 2. Positive for cardiomegaly with trace pleural effusions and small volume of simple appearing pelvic free fluid. 3. Generalized abdominal mesenteric and pararenal stranding is nonspecific, as is mild gallbladder wall edema or pericholecystic fluid. This might be related to anasarca, but there is a lack of generalized subcutaneous edema. 4. Hyperdensity of the liver parenchyma such as can be seen with chronic amiodarone therapy. Query liver insufficiency which might also explain #3. 5. A 15 mm hyperdense object in the left abdominal small bowel is nonspecific but somewhat resembles a tooth. Is there any evidence of recent tooth loss and accidental ingestion. Electronically Signed   By: Genevie Ann M.D.   On: 11/22/2018 16:17   Dg Chest 2 View  Result Date: 11/22/2018 CLINICAL DATA:  Cough and shortness of breath EXAM: CHEST - 2 VIEW COMPARISON:  November 09, 2018 FINDINGS: There is no edema or consolidation. There is cardiomegaly with a degree of pulmonary venous hypertension. No adenopathy. No bone lesions. IMPRESSION: Pulmonary vascular congestion without edema or consolidation. No adenopathy evident. Electronically Signed   By: Lowella Grip III M.D.   On: 11/22/2018 09:37    Cardiac Studies:  Assessment/Plan:  Decompensated systolic congestive heart failure precipitated by anemia Severe symptomatic microcytic hypochromic anemia rule out GI loss Hypertension History of paroxysmal atrial fibrillation on chronic anticoagulation Questionable ischemic/nonischemic dilated cardiomyopathy Alcoholic liver  disease Polysubstance abuse Plan  Continue present management. Increase angiotensin receptor blocker as blood pressure tolerates Follow serial H&H. DC Coumadin Consider hematology consultation. Patient consult regarding complete cessation of alcohol and drugs Will need further evaluation of valvular heart disease as outpatient with repeat echo . Follow-up with Dr.Kadakia in one week  LOS: 1 day    Charolette Forward 11/23/2018, 12:56 PM

## 2018-11-23 NOTE — Plan of Care (Signed)

## 2018-11-23 NOTE — Progress Notes (Addendum)
PROGRESS NOTE    CORDELL GUERCIO   LGX:[id number]  DOB: 05/07/1981  DOA: 11/22/2018 PCP: Dixie Dials, MD   Brief Narrative:  Matthew Joseph is a 37 y/o male with h/o alcoholism, HTN, PAF on Coumadin, HFrEF, marijauna abuse who presented for exertional dyspnea.  Found to have Hb of 2.7 in ED (checked twice). INR > 10, Sodium 124, Cr 1.5.  CXR > mild pulm vascular congestion.  Lasix was recently increased by his PCP (Dr Doylene Canard) due to pedal edema from 20 to 40 mg. .   Subjective: No dyspnea at rest- has not walked. No chest pain. Admits to black stools about 2 wks ago but now stools are brown. No GI symptoms. Continues to drink 2 12 oz beers a day. Smokes a couple of cigarettes per day.     Assessment & Plan:   Principal Problem:   Severe anemia with Iron deficiency/ thrombocytosis - hemoccult neg currently but had black stools a couple of weeks ago in setting of Coumadin use and alcohol abuse - has been transfused 4 U PRBC so far- Hb now 8.5 - I have consulted GI today-  - Ferretin 18- will give a dose of Feraheme today - thrombocytosis likely due to severe anemia  Active Problems:  Acute on Chronic Systolic CHF , hyponatremia - likely related to severe anemia- high output cardiac failure - he received 2 doses of IV lasix 20 mg each which been d/c'd today - He states his edema gets worse the longer he stands at work (he is a Training and development officer) and goes down by the time he wakes up in the AM. He wears TEDs as well - last ECHO in 2013> 35% to 40%. 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 - at home, on Digoxin, Lopressor and Amlodipine (states he does not feel he needs the Amlodipine and does not take it) - Cozaar started in house by cardiology -have ordered I and O and daily weights- lasix on hold for now  AKI - Cr 1.5 has improved to 0.9  PAF - on Dig, Amio and Lopressor-  Amio stopped by cardiology during this admission - currently in NSR - hold Coumadin  Hypercoaguable state - in setting of Coumadin use and INR > 10- patient tells me his last level was checked about 1 yr ago but told the admitting doctor it was a couple of months ago - hold Coumadin for now- INR now down to 4 after 1 U FFP and Vit K 5 mg - will give 2.5 mg today - goal is to get < 2 so GI can scope     Alcohol abuse/ elevated LFTs/ hypoalbuminemia - still drinking at least 2 12 oz a day- advised to stop this - CT abd/pelvis> Hyperdensity of the liver parenchyma - ? Due to Amio per radiology read- I suspect he may have cirrhosis-  F/u on GI eval  Marijuana and nicotine abuse - smokes about 2 cigarettes per day - admits to using Marijauna  NOTE: he states his insurance starts in January. I have also placed an order to obtain last labs from Dr Merrilee Jansky office specifically CBC and INR and last note.  DVT prophylaxis: INR still elevated Code Status: Full code Family Communication: brother sitting at bedside Disposition Plan: follow on telemetry Consultants:   Cardiology  GI Procedures:   2 D ECHO Left ventricle: The cavity size  was normal. There was mild   concentric hypertrophy. Systolic function was mildly reduced. The   estimated ejection fraction was in the range of 45% to 50%.   Diffuse hypokinesis. - Aortic valve: There was mild regurgitation. - Mitral valve: There was moderate regurgitation. - Atrial septum: No defect or patent foramen ovale was identified. - Tricuspid valve: There was severe regurgitation. - Pulmonary arteries: Systolic pressure was mildly increased. - Pericardium, extracardiac: A small pericardial effusion was   identified circumferential to the heart. There was no evidence of   hemodynamic compromise. Antimicrobials:  Anti-infectives (From admission, onward)   None       Objective: Vitals:   11/23/18 1100 11/23/18 1200 11/23/18 1300 11/23/18  1400  BP: 115/70 120/69 121/79 128/76  Pulse: 61 60 61 66  Resp: 17 18 10 11   Temp:  98.3 F (36.8 C)    TempSrc:  Oral    SpO2: 97% 99% 98% 100%  Weight:      Height:        Intake/Output Summary (Last 24 hours) at 11/23/2018 1456 Last data filed at 11/23/2018 1000 Gross per 24 hour  Intake 1800 ml  Output 2280 ml  Net -480 ml   Filed Weights   11/22/18 0916 11/22/18 1715  Weight: 66.2 kg 65.5 kg    Examination: General exam: Appears comfortable  HEENT: PERRLA, oral mucosa moist, no sclera icterus or thrush Respiratory system: Clear to auscultation. Respiratory effort normal. Cardiovascular system: S1 & S2 heard, RRR.   Gastrointestinal system: Abdomen soft, non-tender, nondistended. Normal bowel sounds. Central nervous system: Alert and oriented. No focal neurological deficits. Extremities: No cyanosis, clubbing or edema Skin: No rashes or ulcers Psychiatry:  Mood & affect appropriate.     Data Reviewed: I have personally reviewed following labs and imaging studies  CBC: Recent Labs  Lab 11/22/18 0922 11/22/18 1135 11/22/18 1139 11/23/18 0026 11/23/18 1118  WBC 16.1* 14.6*  --   --  14.2*  NEUTROABS 12.1* 10.4*  --   --  11.9*  HGB 2.9* 2.7* 5.1* 5.8* 8.5*  HCT 12.2* 11.3* 15.0* 19.4* 27.8*  MCV 62.2* 63.5*  --   --  79.0*  PLT 540* 502*  --   --  174*   Basic Metabolic Panel: Recent Labs  Lab 11/22/18 0922 11/22/18 1135 11/22/18 1139 11/23/18 1118  NA 123* 123* 125* 131*  K 4.4 4.2 4.2 3.6  CL 90* 90* 90* 96*  CO2 22 22  --  24  GLUCOSE 117* 112* 110* 112*  BUN 18 19 19 10   CREATININE 1.54* 1.39* 1.50* 0.96  CALCIUM 8.5* 8.2*  --  8.2*   GFR: Estimated Creatinine Clearance: 95.7 mL/min (by C-G formula based on SCr of 0.96 mg/dL). Liver Function Tests: Recent Labs  Lab 11/22/18 0922 11/22/18 1135 11/23/18 1118  AST 86* 80* 82*  ALT 48* 43 44  ALKPHOS 68 65 66  BILITOT 2.7* 2.7* 3.7*  PROT 8.1 7.6 7.3  ALBUMIN 2.7* 2.6* 2.5*   No  results for input(s): LIPASE, AMYLASE in the last 168 hours. No results for input(s): AMMONIA in the last 168 hours. Coagulation Profile: Recent Labs  Lab 11/22/18 1135 11/23/18 0026 11/23/18 1118  INR >10.00* 5.47* 4.98*   Cardiac Enzymes: No results for input(s): CKTOTAL, CKMB, CKMBINDEX, TROPONINI in the last 168 hours. BNP (last 3 results) No results for input(s): PROBNP in the last 8760 hours. HbA1C: No results for input(s): HGBA1C in the last 72 hours. CBG: No results  for input(s): GLUCAP in the last 168 hours. Lipid Profile: No results for input(s): CHOL, HDL, LDLCALC, TRIG, CHOLHDL, LDLDIRECT in the last 72 hours. Thyroid Function Tests: No results for input(s): TSH, T4TOTAL, FREET4, T3FREE, THYROIDAB in the last 72 hours. Anemia Panel: Recent Labs    11/22/18 1309  VITAMINB12 2,342*  FOLATE 29.0  FERRITIN 18*  TIBC 535*  IRON 11*  RETICCTPCT 2.3   Urine analysis:    Component Value Date/Time   COLORURINE AMBER (A) 05/08/2012 2237   APPEARANCEUR CLOUDY (A) 05/08/2012 2237   LABSPEC 1.020 05/08/2012 2237   PHURINE 5.5 05/08/2012 2237   GLUCOSEU NEGATIVE 05/08/2012 2237   HGBUR NEGATIVE 05/08/2012 2237   BILIRUBINUR MODERATE (A) 05/08/2012 2237   KETONESUR NEGATIVE 05/08/2012 2237   PROTEINUR NEGATIVE 05/08/2012 2237   UROBILINOGEN 1.0 05/08/2012 2237   NITRITE NEGATIVE 05/08/2012 2237   LEUKOCYTESUR NEGATIVE 05/08/2012 2237   Sepsis Labs: @LABRCNTIP (procalcitonin:4,lacticidven:4) ) Recent Results (from the past 240 hour(s))  MRSA PCR Screening     Status: None   Collection Time: 11/22/18  5:24 PM  Result Value Ref Range Status   MRSA by PCR NEGATIVE NEGATIVE Final    Comment:        The GeneXpert MRSA Assay (FDA approved for NASAL specimens only), is one component of a comprehensive MRSA colonization surveillance program. It is not intended to diagnose MRSA infection nor to guide or monitor treatment for MRSA infections. Performed at [facility], [place] [address] [place], [place] [address]          Radiology Studies: Ct Abdomen Pelvis Wo Contrast  Result Date: 11/22/2018 CLINICAL DATA:  37 year old male with generalized weakness. INR greater than 10 and anemia with decreased hemoglobin. Query retroperitoneal hemorrhage. EXAM: CT ABDOMEN AND PELVIS WITHOUT CONTRAST TECHNIQUE: Multidetector CT imaging of the abdomen and pelvis was performed following the standard protocol without IV contrast. COMPARISON:  Lumbar radiographs 09/14/2006. FINDINGS: Lower chest: Cardiomegaly. No pericardial effusion. Small or trace bilateral layering pleural effusions, but otherwise negative lung bases. Hepatobiliary: Hyperdensity of the liver parenchyma such as can be seen with chronic amiodarone. There is mild gallbladder wall thickening or pericholecystic fluid, but no associated pericholecystic fat stranding. Pancreas: Negative noncontrast pancreas. Spleen: Negative. Adrenals/Urinary Tract: Moderate bilateral pararenal space and perinephric stranding appears nonspecific. No nephrolithiasis. No hydronephrosis. Negative adrenal glands. Retroperitoneal stranding bilaterally appears symmetric. There is no discrete retroperitoneal hematoma. Unremarkable urinary bladder. Stomach/Bowel: Negative descending and rectosigmoid colon. Transverse colon is mildly redundant. Negative right colon, appendix (coronal image 66) and terminal ileum. No dilated small bowel. There is a linear 15 millimeter hyperdense object in the left lower quadrant which may be in a small bowel loop and on coronal images somewhat resembles dentition (series 6, image 75). The stomach is decompressed. No free air. There is mild generalized mesenteric stranding throughout the abdomen, but no discrete abdominal free fluid. Vascular/Lymphatic: Occasional calcified atherosclerosis. Vascular patency is not evaluated in the absence of IV contrast. No lymphadenopathy. Reproductive: Negative. Other:  Small volume of free fluid in the pelvis but with simple fluid density (series 3, image 71). Superimposed mild to moderate nonspecific presacral stranding. Musculoskeletal: No acute osseous abnormality identified. Bone mineralization is within normal limits. Chronic L5-S1 disc degeneration with vacuum disc. IMPRESSION: 1. Negative for retroperitoneal or other discrete hematoma to explain anemia. 2. Positive for cardiomegaly with trace pleural effusions and small volume of simple appearing pelvic free fluid. 3. Generalized abdominal mesenteric and pararenal stranding is nonspecific, as is  mild gallbladder wall edema or pericholecystic fluid. This might be related to anasarca, but there is a lack of generalized subcutaneous edema. 4. Hyperdensity of the liver parenchyma such as can be seen with chronic amiodarone therapy. Query liver insufficiency which might also explain #3. 5. A 15 mm hyperdense object in the left abdominal small bowel is nonspecific but somewhat resembles a tooth. Is there any evidence of recent tooth loss and accidental ingestion. Electronically Signed   By: Genevie Ann M.D.   On: 11/22/2018 16:17   Dg Chest 2 View  Result Date: 11/22/2018 CLINICAL DATA:  Cough and shortness of breath EXAM: CHEST - 2 VIEW COMPARISON:  November 09, 2018 FINDINGS: There is no edema or consolidation. There is cardiomegaly with a degree of pulmonary venous hypertension. No adenopathy. No bone lesions. IMPRESSION: Pulmonary vascular congestion without edema or consolidation. No adenopathy evident. Electronically Signed   By: Lowella Grip III M.D.   On: 11/22/2018 09:37      Scheduled Meds: . sodium chloride   Intravenous Once  . sodium chloride   Intravenous Once  . chlorhexidine  15 mL Mouth Rinse BID  . digoxin  0.0625 mg Oral Daily  . furosemide  20 mg Intravenous Once  . losartan  25 mg Oral Daily  . mouth rinse  15 mL Mouth Rinse q12n4p  . metoprolol tartrate  25 mg Oral BID  . potassium  chloride  10 mEq Oral Daily   Continuous Infusions:   LOS: 1 day    Time spent in minutes: 58 min    Debbe Odea, MD [facility] Pager: [email or web address] Password [facility] 11/23/2018, 2:56 PM

## 2018-11-24 LAB — CBC
HCT: 29.4 % — ABNORMAL LOW (ref 39.0–52.0)
Hemoglobin: 8.8 g/dL — ABNORMAL LOW (ref 13.0–17.0)
MCH: 23.5 pg — ABNORMAL LOW (ref 26.0–34.0)
MCHC: 29.9 g/dL — ABNORMAL LOW (ref 30.0–36.0)
MCV: 78.4 fL — ABNORMAL LOW (ref 80.0–100.0)
Platelets: 447 10*3/uL — ABNORMAL HIGH (ref 150–400)
RBC: 3.75 MIL/uL — ABNORMAL LOW (ref 4.22–5.81)
RDW: 27.9 % — ABNORMAL HIGH (ref 11.5–15.5)
WBC: 12.8 10*3/uL — AB (ref 4.0–10.5)
nRBC: 1.7 % — ABNORMAL HIGH (ref 0.0–0.2)

## 2018-11-24 LAB — HAPTOGLOBIN: Haptoglobin: 131 mg/dL (ref 17–317)

## 2018-11-24 LAB — PROTIME-INR
INR: 3.74
Prothrombin Time: 36.5 seconds — ABNORMAL HIGH (ref 11.4–15.2)

## 2018-11-24 MED ORDER — PHYTONADIONE 5 MG PO TABS
2.5000 mg | ORAL_TABLET | Freq: Once | ORAL | Status: AC
  Administered 2018-11-24: 2.5 mg via ORAL
  Filled 2018-11-24: qty 1

## 2018-11-24 NOTE — Plan of Care (Signed)
  Problem: Education: Goal: Knowledge of General Education information will improve Description Including pain rating scale, medication(s)/side effects and non-pharmacologic comfort measures 11/24/2018 1735 by Evalee Jefferson, RN Outcome: Progressing 11/24/2018 1331 by Evalee Jefferson, RN Outcome: Progressing   Problem: Health Behavior/Discharge Planning: Goal: Ability to manage health-related needs will improve 11/24/2018 1735 by Evalee Jefferson, RN Outcome: Progressing 11/24/2018 1331 by Evalee Jefferson, RN Outcome: Progressing   Problem: Clinical Measurements: Goal: Ability to maintain clinical measurements within normal limits will improve 11/24/2018 1735 by Evalee Jefferson, RN Outcome: Progressing 11/24/2018 1331 by Evalee Jefferson, RN Outcome: Progressing Goal: Will remain free from infection 11/24/2018 1735 by Evalee Jefferson, RN Outcome: Progressing 11/24/2018 1331 by Evalee Jefferson, RN Outcome: Progressing Goal: Diagnostic test results will improve 11/24/2018 1735 by Evalee Jefferson, RN Outcome: Progressing 11/24/2018 1331 by Evalee Jefferson, RN Outcome: Progressing Goal: Respiratory complications will improve 11/24/2018 1735 by Evalee Jefferson, RN Outcome: Progressing 11/24/2018 1331 by Evalee Jefferson, RN Outcome: Progressing Goal: Cardiovascular complication will be avoided 11/24/2018 1735 by Evalee Jefferson, RN Outcome: Progressing 11/24/2018 1331 by Evalee Jefferson, RN Outcome: Progressing   Problem: Activity: Goal: Risk for activity intolerance will decrease 11/24/2018 1735 by Evalee Jefferson, RN Outcome: Progressing 11/24/2018 1331 by Evalee Jefferson, RN Outcome: Progressing   Problem: Nutrition: Goal: Adequate nutrition will be maintained 11/24/2018 1735 by Evalee Jefferson, RN Outcome: Progressing 11/24/2018 1331 by Evalee Jefferson, RN Outcome: Progressing   Problem: Coping: Goal: Level of anxiety will decrease 11/24/2018 1735 by Evalee Jefferson, RN Outcome: Progressing 11/24/2018 1331 by Evalee Jefferson, RN Outcome: Progressing   Problem: Elimination: Goal: Will not experience complications related to bowel motility 11/24/2018 1735 by Evalee Jefferson, RN Outcome: Progressing 11/24/2018 1331 by Evalee Jefferson, RN Outcome: Progressing Goal: Will not experience complications related to urinary retention 11/24/2018 1735 by Evalee Jefferson, RN Outcome: Progressing 11/24/2018 1331 by Evalee Jefferson, RN Outcome: Progressing   Problem: Pain Managment: Goal: General experience of comfort will improve 11/24/2018 1735 by Evalee Jefferson, RN Outcome: Progressing 11/24/2018 1331 by Evalee Jefferson, RN Outcome: Progressing   Problem: Safety: Goal: Ability to remain free from injury will improve 11/24/2018 1735 by Evalee Jefferson, RN Outcome: Progressing 11/24/2018 1331 by Evalee Jefferson, RN Outcome: Progressing   Problem: Skin Integrity: Goal: Risk for impaired skin integrity will decrease 11/24/2018 1735 by Evalee Jefferson, RN Outcome: Progressing 11/24/2018 1331 by Evalee Jefferson, RN Outcome: Progressing

## 2018-11-24 NOTE — Progress Notes (Signed)
PROGRESS NOTE    Matthew Joseph   IRC:[id number]  DOB: [birth year]  DOA: 11/22/2018 PCP: Dixie Dials, MD   Brief Narrative:  Matthew Joseph is a 37 y/o male with h/o alcoholism, HTN, PAF on Coumadin, HFrEF, marijauna abuse who presented for exertional dyspnea.  Found to have Hb of 2.7 in ED (checked twice). INR > 10, Sodium 124, Cr 1.5.  CXR > mild pulm vascular congestion.  Lasix was recently increased by his PCP (Dr Doylene Canard) due to pedal edema from 20 to 40 mg. .   Subjective: No complaints today.     Assessment & Plan:   Principal Problem:   Severe anemia with Iron deficiency/ thrombocytosis - hemoccult neg currently but had black stools a couple of weeks ago in setting of Coumadin use and alcohol abuse - has been transfused 4 U PRBC so far- Hb now 8.5 - I have consulted GI - GI plans on colonoscopy Friday  - Ferretin 18-  given a dose of Feraheme on 12/24 - thrombocytosis likely due to severe anemia  Active Problems:  Acute on Chronic Systolic CHF , hyponatremia - likely related to severe anemia- high output cardiac failure - he received 2 doses of IV lasix 20 mg each which been d/c'd today - He states his edema gets worse the longer he stands at work (he is a Training and development officer) and goes down by the time he wakes up in the AM. He wears TEDs as well - last ECHO in 2013> 35% to 40%. 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- he also states he was using cocaine back when he was first diagnosed many years ago - at home, on Digoxin, Lopressor and Amlodipine (states he does not feel he needs the Amlodipine and does not take it) - Cozaar started in house by cardiology -have ordered I and O and daily weights- lasix on hold for now  AKI - Cr 1.5 has improved to 0.9  PAF - on Dig, Amio and Lopressor- Amio stopped by cardiology during this admission - currently in NSR - hold  Coumadin  Hypercoaguable state - in setting of Coumadin use and INR > 10- patient tells me his last level was checked about 1 yr ago but told the admitting doctor it was a couple of months ago - hold Coumadin for now- INR now down to 4 after 1 U FFP and Vit K 5 mg - will give 2.5 mg today - goal is to get < 2 so GI can scope     Alcohol abuse/ elevated LFTs/ hypoalbuminemia - still drinking at least 2 12 oz a day- advised to stop this - CT abd/pelvis> Hyperdensity of the liver parenchyma - ? Due to Amio per radiology read- I suspect he may have cirrhosis-  F/u on GI eval  Marijuana and nicotine abuse - smokes about 2 cigarettes per day - admits to using Marijauna  NOTE: he states his insurance starts in January. I have also placed an order to obtain last labs from Dr Merrilee Jansky office specifically CBC and INR and last note.  DVT prophylaxis: INR still elevated Code Status: Full code Family Communication: brother sitting at bedside Disposition Plan: follow on telemetry Consultants:   Cardiology  GI Procedures:   2 D ECHO Left ventricle: The cavity size was normal. There was mild   concentric hypertrophy. Systolic function was mildly reduced. The  estimated ejection fraction was in the range of 45% to 50%.   Diffuse hypokinesis. - Aortic valve: There was mild regurgitation. - Mitral valve: There was moderate regurgitation. - Atrial septum: No defect or patent foramen ovale was identified. - Tricuspid valve: There was severe regurgitation. - Pulmonary arteries: Systolic pressure was mildly increased. - Pericardium, extracardiac: A small pericardial effusion was   identified circumferential to the heart. There was no evidence of   hemodynamic compromise. Antimicrobials:  Anti-infectives (From admission, onward)   None       Objective: Vitals:   11/23/18 1659 11/23/18 2111 11/24/18 0518 11/24/18 1000  BP: 120/76 120/78 122/74 120/67  Pulse: 61 62 67 63  Resp: 19 18 18  19   Temp: 98.3 F (36.8 C) 98.7 F (37.1 C) 98.2 F (36.8 C) 98.1 F (36.7 C)  TempSrc: Oral Oral Oral Oral  SpO2: 100% 100% 96% 100%  Weight:  65.4 kg    Height:        Intake/Output Summary (Last 24 hours) at 11/24/2018 1515 Last data filed at 11/24/2018 1457 Gross per 24 hour  Intake 670 ml  Output 480 ml  Net 190 ml   Filed Weights   11/22/18 0916 11/22/18 1715 11/23/18 2111  Weight: 66.2 kg 65.5 kg 65.4 kg    Examination: General exam: Appears comfortable  HEENT: PERRLA, oral mucosa moist, no sclera icterus or thrush Respiratory system: Clear to auscultation. Respiratory effort normal. Cardiovascular system: S1 & S2 heard,  No murmurs  Gastrointestinal system: Abdomen soft, non-tender, nondistended. Normal bowel sound. No organomegaly Central nervous system: Alert and oriented. No focal neurological deficits. Extremities: No cyanosis, clubbing or edema Skin: No rashes or ulcers Psychiatry:  Mood & affect appropriate.      Data Reviewed: I have personally reviewed following labs and imaging studies  CBC: Recent Labs  Lab 11/22/18 0922 11/22/18 1135 11/22/18 1139 11/23/18 0026 11/23/18 1118 11/24/18 0613  WBC 16.1* 14.6*  --   --  14.2* 12.8*  NEUTROABS 12.1* 10.4*  --   --  11.9*  --   HGB 2.9* 2.7* 5.1* 5.8* 8.5* 8.8*  HCT 12.2* 11.3* 15.0* 19.4* 27.8* 29.4*  MCV 62.2* 63.5*  --   --  79.0* 78.4*  PLT 540* 502*  --   --  421* 893*   Basic Metabolic Panel: Recent Labs  Lab 11/22/18 0922 11/22/18 1135 11/22/18 1139 11/23/18 1118 11/23/18 1551  NA 123* 123* 125* 131* 131*  K 4.4 4.2 4.2 3.6 3.8  CL 90* 90* 90* 96* 96*  CO2 22 22  --  24 26  GLUCOSE 117* 112* 110* 112* 118*  BUN 18 19 19 10 10   CREATININE 1.54* 1.39* 1.50* 0.96 0.98  CALCIUM 8.5* 8.2*  --  8.2* 8.1*   GFR: Estimated Creatinine Clearance: 93.6 mL/min (by C-G formula based on SCr of 0.98 mg/dL). Liver Function Tests: Recent Labs  Lab 11/22/18 8101 11/22/18 1135  11/23/18 1118 11/23/18 1551  AST 86* 80* 82* 82*  ALT 48* 43 44 43  ALKPHOS 68 65 66 71  BILITOT 2.7* 2.7* 3.7* 3.6*  PROT 8.1 7.6 7.3 7.3  ALBUMIN 2.7* 2.6* 2.5* 2.4*   No results for input(s): LIPASE, AMYLASE in the last 168 hours. No results for input(s): AMMONIA in the last 168 hours. Coagulation Profile: Recent Labs  Lab 11/22/18 1135 11/23/18 0026 11/23/18 1118 11/24/18 0613  INR >10.00* 5.47* 4.98* 3.74   Cardiac Enzymes: No results for input(s): CKTOTAL, CKMB, CKMBINDEX, TROPONINI in  the last 168 hours. BNP (last 3 results) No results for input(s): PROBNP in the last 8760 hours. HbA1C: No results for input(s): HGBA1C in the last 72 hours. CBG: No results for input(s): GLUCAP in the last 168 hours. Lipid Profile: No results for input(s): CHOL, HDL, LDLCALC, TRIG, CHOLHDL, LDLDIRECT in the last 72 hours. Thyroid Function Tests: No results for input(s): TSH, T4TOTAL, FREET4, T3FREE, THYROIDAB in the last 72 hours. Anemia Panel: Recent Labs    11/22/18 1309  VITAMINB12 2,342*  FOLATE 29.0  FERRITIN 18*  TIBC 535*  IRON 11*  RETICCTPCT 2.3   Urine analysis:    Component Value Date/Time   COLORURINE AMBER (A) 05/08/2012 2237   APPEARANCEUR CLOUDY (A) 05/08/2012 2237   LABSPEC 1.020 05/08/2012 2237   PHURINE 5.5 05/08/2012 2237   GLUCOSEU NEGATIVE 05/08/2012 2237   HGBUR NEGATIVE 05/08/2012 2237   BILIRUBINUR MODERATE (A) 05/08/2012 2237   KETONESUR NEGATIVE 05/08/2012 2237   PROTEINUR NEGATIVE 05/08/2012 2237   UROBILINOGEN 1.0 05/08/2012 2237   NITRITE NEGATIVE 05/08/2012 2237   LEUKOCYTESUR NEGATIVE 05/08/2012 2237   Sepsis Labs: @LABRCNTIP (procalcitonin:4,lacticidven:4) ) Recent Results (from the past 240 hour(s))  MRSA PCR Screening     Status: None   Collection Time: 11/22/18  5:24 PM  Result Value Ref Range Status   MRSA by PCR NEGATIVE NEGATIVE Final    Comment:        The GeneXpert MRSA Assay (FDA approved for NASAL specimens only), is  one component of a comprehensive MRSA colonization surveillance program. It is not intended to diagnose MRSA infection nor to guide or monitor treatment for MRSA infections. Performed at [facility], Normal [address], [place], [place] [address]          Radiology Studies: Ct Abdomen Pelvis Wo Contrast  Result Date: 11/22/2018 CLINICAL DATA:  37 year old male with generalized weakness. INR greater than 10 and anemia with decreased hemoglobin. Query retroperitoneal hemorrhage. EXAM: CT ABDOMEN AND PELVIS WITHOUT CONTRAST TECHNIQUE: Multidetector CT imaging of the abdomen and pelvis was performed following the standard protocol without IV contrast. COMPARISON:  Lumbar radiographs 09/14/2006. FINDINGS: Lower chest: Cardiomegaly. No pericardial effusion. Small or trace bilateral layering pleural effusions, but otherwise negative lung bases. Hepatobiliary: Hyperdensity of the liver parenchyma such as can be seen with chronic amiodarone. There is mild gallbladder wall thickening or pericholecystic fluid, but no associated pericholecystic fat stranding. Pancreas: Negative noncontrast pancreas. Spleen: Negative. Adrenals/Urinary Tract: Moderate bilateral pararenal space and perinephric stranding appears nonspecific. No nephrolithiasis. No hydronephrosis. Negative adrenal glands. Retroperitoneal stranding bilaterally appears symmetric. There is no discrete retroperitoneal hematoma. Unremarkable urinary bladder. Stomach/Bowel: Negative descending and rectosigmoid colon. Transverse colon is mildly redundant. Negative right colon, appendix (coronal image 66) and terminal ileum. No dilated small bowel. There is a linear 15 millimeter hyperdense object in the left lower quadrant which may be in a small bowel loop and on coronal images somewhat resembles dentition (series 6, image 75). The stomach is decompressed. No free air. There is mild generalized mesenteric stranding throughout the abdomen, but no  discrete abdominal free fluid. Vascular/Lymphatic: Occasional calcified atherosclerosis. Vascular patency is not evaluated in the absence of IV contrast. No lymphadenopathy. Reproductive: Negative. Other: Small volume of free fluid in the pelvis but with simple fluid density (series 3, image 71). Superimposed mild to moderate nonspecific presacral stranding. Musculoskeletal: No acute osseous abnormality identified. Bone mineralization is within normal limits. Chronic L5-S1 disc degeneration with vacuum disc. IMPRESSION: 1. Negative for retroperitoneal or other discrete  hematoma to explain anemia. 2. Positive for cardiomegaly with trace pleural effusions and small volume of simple appearing pelvic free fluid. 3. Generalized abdominal mesenteric and pararenal stranding is nonspecific, as is mild gallbladder wall edema or pericholecystic fluid. This might be related to anasarca, but there is a lack of generalized subcutaneous edema. 4. Hyperdensity of the liver parenchyma such as can be seen with chronic amiodarone therapy. Query liver insufficiency which might also explain #3. 5. A 15 mm hyperdense object in the left abdominal small bowel is nonspecific but somewhat resembles a tooth. Is there any evidence of recent tooth loss and accidental ingestion. Electronically Signed   By: Genevie Ann M.D.   On: 11/22/2018 16:17      Scheduled Meds: . sodium chloride   Intravenous Once  . sodium chloride   Intravenous Once  . chlorhexidine  15 mL Mouth Rinse BID  . digoxin  0.0625 mg Oral Daily  . furosemide  20 mg Intravenous Once  . losartan  25 mg Oral Daily  . mouth rinse  15 mL Mouth Rinse q12n4p  . metoprolol tartrate  25 mg Oral BID  . potassium chloride  10 mEq Oral Daily   Continuous Infusions:   LOS: 2 days    Time spent in minutes: 67 min    Debbe Odea, MD [facility] Pager: [email or web address] Password [facility] 11/24/2018, 3:15 PM

## 2018-11-24 NOTE — Progress Notes (Signed)
Subjective:  Patient denies any chest pain or shortness of breath.  Denies abdominal pain.  States had black tarry stool x2 about a week ago.  GI work-up will be initiated once INR in therapeutic range.  Objective:  Vital Signs in the last 24 hours: Temp:  [98.2 F (36.8 C)-98.7 F (37.1 C)] 98.2 F (36.8 C) (12/25 0518) Pulse Rate:  [60-67] 67 (12/25 0518) Resp:  [10-19] 18 (12/25 0518) BP: (115-128)/(68-79) 122/74 (12/25 0518) SpO2:  [96 %-100 %] 96 % (12/25 0518) Weight:  [65.4 kg] 65.4 kg (12/24 2111)  Intake/Output from previous day: 12/24 0701 - 12/25 0700 In: 1340 [P.O.:840; Blood:350; IV Piggyback:150] Out: 1280 [Urine:1280] Intake/Output from this shift: No intake/output data recorded.  Physical Exam: Neck: no adenopathy, no carotid bruit, no JVD and supple, symmetrical, trachea midline Lungs: clear to auscultation bilaterally Heart: regular rate and rhythm, S1, S2 normal and 2/6 systolic murmur noted Abdomen: soft, non-tender; bowel sounds normal; no masses,  no organomegaly Extremities: extremities normal, atraumatic, no cyanosis or edema  Lab Results: Recent Labs    11/23/18 1118 11/24/18 0613  WBC 14.2* 12.8*  HGB 8.5* 8.8*  PLT 421* 447*   Recent Labs    11/23/18 1118 11/23/18 1551  NA 131* 131*  K 3.6 3.8  CL 96* 96*  CO2 24 26  GLUCOSE 112* 118*  BUN 10 10  CREATININE 0.96 0.98   No results for input(s): TROPONINI in the last 72 hours.  Invalid input(s): CK, MB Hepatic Function Panel Recent Labs    11/23/18 1551  PROT 7.3  ALBUMIN 2.4*  AST 82*  ALT 43  ALKPHOS 71  BILITOT 3.6*   No results for input(s): CHOL in the last 72 hours. No results for input(s): PROTIME in the last 72 hours.  Imaging: Imaging results have been reviewed and Ct Abdomen Pelvis Wo Contrast  Result Date: 11/22/2018 CLINICAL DATA:  37 year old male with generalized weakness. INR greater than 10 and anemia with decreased hemoglobin. Query retroperitoneal  hemorrhage. EXAM: CT ABDOMEN AND PELVIS WITHOUT CONTRAST TECHNIQUE: Multidetector CT imaging of the abdomen and pelvis was performed following the standard protocol without IV contrast. COMPARISON:  Lumbar radiographs 09/14/2006. FINDINGS: Lower chest: Cardiomegaly. No pericardial effusion. Small or trace bilateral layering pleural effusions, but otherwise negative lung bases. Hepatobiliary: Hyperdensity of the liver parenchyma such as can be seen with chronic amiodarone. There is mild gallbladder wall thickening or pericholecystic fluid, but no associated pericholecystic fat stranding. Pancreas: Negative noncontrast pancreas. Spleen: Negative. Adrenals/Urinary Tract: Moderate bilateral pararenal space and perinephric stranding appears nonspecific. No nephrolithiasis. No hydronephrosis. Negative adrenal glands. Retroperitoneal stranding bilaterally appears symmetric. There is no discrete retroperitoneal hematoma. Unremarkable urinary bladder. Stomach/Bowel: Negative descending and rectosigmoid colon. Transverse colon is mildly redundant. Negative right colon, appendix (coronal image 66) and terminal ileum. No dilated small bowel. There is a linear 15 millimeter hyperdense object in the left lower quadrant which may be in a small bowel loop and on coronal images somewhat resembles dentition (series 6, image 75). The stomach is decompressed. No free air. There is mild generalized mesenteric stranding throughout the abdomen, but no discrete abdominal free fluid. Vascular/Lymphatic: Occasional calcified atherosclerosis. Vascular patency is not evaluated in the absence of IV contrast. No lymphadenopathy. Reproductive: Negative. Other: Small volume of free fluid in the pelvis but with simple fluid density (series 3, image 71). Superimposed mild to moderate nonspecific presacral stranding. Musculoskeletal: No acute osseous abnormality identified. Bone mineralization is within normal limits. Chronic L5-S1 disc degeneration  with vacuum disc. IMPRESSION: 1. Negative for retroperitoneal or other discrete hematoma to explain anemia. 2. Positive for cardiomegaly with trace pleural effusions and small volume of simple appearing pelvic free fluid. 3. Generalized abdominal mesenteric and pararenal stranding is nonspecific, as is mild gallbladder wall edema or pericholecystic fluid. This might be related to anasarca, but there is a lack of generalized subcutaneous edema. 4. Hyperdensity of the liver parenchyma such as can be seen with chronic amiodarone therapy. Query liver insufficiency which might also explain #3. 5. A 15 mm hyperdense object in the left abdominal small bowel is nonspecific but somewhat resembles a tooth. Is there any evidence of recent tooth loss and accidental ingestion. Electronically Signed   By: Genevie Ann M.D.   On: 11/22/2018 16:17    Cardiac Studies:  Assessment/Plan:  compensated systolic congestive heart failure precipitated by anemia Severe symptomatic microcytic hypochromic anemia rule out GI loss Hypertension History of paroxysmal atrial fibrillation on chronic anticoagulation Questionable ischemic/nonischemic dilated cardiomyopathy Valvular heart disease Alcoholic liver disease Polysubstance abuse Plan Continue present management Dr. Doylene Canard to follow from a.m.   LOS: 2 days    Matthew Joseph 11/24/2018, 9:45 AM

## 2018-11-24 NOTE — Progress Notes (Signed)
[facility] Subjective: Patient seems to be doing well today. He denies having any nausea vomiting abdominal pain melena or hematochezia. He was admitted with severe anemia and guaiac negative stools. EGD and colonoscopy have been dictated because of his coagulopathy and INR being over 10 as he is on chronic anticoagulation for chronic atrial fibrillation. He denies having a history of a rectal bleeding or melena in the past he does have some bleeding from his gums but has no other source of overt blood loss that he is aware of. He denies having any chest pain or shortness of breath. Objective: Vital signs in last 24 hours: Temp:  [98.1 F (36.7 C)-98.7 F (37.1 C)] 98.1 F (36.7 C) (12/25 1000) Pulse Rate:  [60-67] 63 (12/25 1000) Resp:  [10-19] 19 (12/25 1000) BP: (120-128)/(67-79) 120/67 (12/25 1000) SpO2:  [96 %-100 %] 100 % (12/25 1000) Weight:  [65.4 kg] 65.4 kg (12/24 2111) Last BM Date: 11/23/18  Intake/Output from previous day: 12/24 0701 - 12/25 0700 In: 1340 [P.O.:840; Blood:350; IV Piggyback:150] Out: 1280 [Urine:1280] Intake/Output this shift: Total I/O In: 300 [P.O.:300] Out: -   General appearance: alert, cooperative, no distress and pale Resp: clear to auscultation bilaterally Cardio: regular rate and rhythm, S1, S2 normal, no murmur, click, rub or gallop GI: soft, non-tender; bowel sounds normal; no masses,  no organomegaly Extremities: extremities normal, atraumatic, no cyanosis or edema  Lab Results: Recent Labs    11/22/18 1135  11/23/18 0026 11/23/18 1118 11/24/18 0613  WBC 14.6*  --   --  14.2* 12.8*  HGB 2.7*   < > 5.8* 8.5* 8.8*  HCT 11.3*   < > 19.4* 27.8* 29.4*  PLT 502*  --   --  421* 447*   < > = values in this interval not displayed.   BMET Recent Labs    11/22/18 1135 11/22/18 1139 11/23/18 1118 11/23/18 1551  NA 123* 125* 131* 131*  K 4.2 4.2 3.6 3.8  CL 90* 90* 96* 96*  CO2 22  --  24 26  GLUCOSE 112* 110* 112* 118*  BUN  19 19 10 10   CREATININE 1.39* 1.50* 0.96 0.98  CALCIUM 8.2*  --  8.2* 8.1*   LFT Recent Labs    11/23/18 1551  PROT 7.3  ALBUMIN 2.4*  AST 82*  ALT 43  ALKPHOS 71  BILITOT 3.6*   PT/INR Recent Labs    11/23/18 1118 11/24/18 0613  LABPROT 45.5* 36.5*  INR 4.98* 3.74     Studies/Results: Ct Abdomen Pelvis Wo Contrast  Result Date: 11/22/2018 CLINICAL DATA:  37 year old male with generalized weakness. INR greater than 10 and anemia with decreased hemoglobin. Query retroperitoneal hemorrhage. EXAM: CT ABDOMEN AND PELVIS WITHOUT CONTRAST TECHNIQUE: Multidetector CT imaging of the abdomen and pelvis was performed following the standard protocol without IV contrast. COMPARISON:  Lumbar radiographs 09/14/2006. FINDINGS: Lower chest: Cardiomegaly. No pericardial effusion. Small or trace bilateral layering pleural effusions, but otherwise negative lung bases. Hepatobiliary: Hyperdensity of the liver parenchyma such as can be seen with chronic amiodarone. There is mild gallbladder wall thickening or pericholecystic fluid, but no associated pericholecystic fat stranding. Pancreas: Negative noncontrast pancreas. Spleen: Negative. Adrenals/Urinary Tract: Moderate bilateral pararenal space and perinephric stranding appears nonspecific. No nephrolithiasis. No hydronephrosis. Negative adrenal glands. Retroperitoneal stranding bilaterally appears symmetric. There is no discrete retroperitoneal hematoma. Unremarkable urinary bladder. Stomach/Bowel: Negative descending and rectosigmoid colon. Transverse colon is mildly redundant. Negative right colon, appendix (coronal image 66) and terminal ileum. No  dilated small bowel. There is a linear 15 millimeter hyperdense object in the left lower quadrant which may be in a small bowel loop and on coronal images somewhat resembles dentition (series 6, image 75). The stomach is decompressed. No free air. There is mild generalized mesenteric stranding throughout the  abdomen, but no discrete abdominal free fluid. Vascular/Lymphatic: Occasional calcified atherosclerosis. Vascular patency is not evaluated in the absence of IV contrast. No lymphadenopathy. Reproductive: Negative. Other: Small volume of free fluid in the pelvis but with simple fluid density (series 3, image 71). Superimposed mild to moderate nonspecific presacral stranding. Musculoskeletal: No acute osseous abnormality identified. Bone mineralization is within normal limits. Chronic L5-S1 disc degeneration with vacuum disc. IMPRESSION: 1. Negative for retroperitoneal or other discrete hematoma to explain anemia. 2. Positive for cardiomegaly with trace pleural effusions and small volume of simple appearing pelvic free fluid. 3. Generalized abdominal mesenteric and pararenal stranding is nonspecific, as is mild gallbladder wall edema or pericholecystic fluid. This might be related to anasarca, but there is a lack of generalized subcutaneous edema. 4. Hyperdensity of the liver parenchyma such as can be seen with chronic amiodarone therapy. Query liver insufficiency which might also explain #3. 5. A 15 mm hyperdense object in the left abdominal small bowel is nonspecific but somewhat resembles a tooth. Is there any evidence of recent tooth loss and accidental ingestion. Electronically Signed   By: Genevie Ann M.D.   On: 11/22/2018 16:17   Medications: I have reviewed the patient's current medications.  Assessment/Plan: 1) Severe symptomatic anemia with coagulopathy in the face of chronic atrial fibrillation requiring treatment with Coumadin; INR is down to 3.7 for today will plan EGD and colonoscopy for Friday 11/26/2018.   LOS: 2 days   Farid Grigorian 11/24/2018, 11:13 AM

## 2018-11-25 ENCOUNTER — Encounter (HOSPITAL_COMMUNITY): Payer: Self-pay | Admitting: Anesthesiology

## 2018-11-25 DIAGNOSIS — K7460 Unspecified cirrhosis of liver: Secondary | ICD-10-CM

## 2018-11-25 LAB — CBC
HCT: 31.2 % — ABNORMAL LOW (ref 39.0–52.0)
Hemoglobin: 9.3 g/dL — ABNORMAL LOW (ref 13.0–17.0)
MCH: 23.8 pg — ABNORMAL LOW (ref 26.0–34.0)
MCHC: 29.8 g/dL — ABNORMAL LOW (ref 30.0–36.0)
MCV: 80 fL (ref 80.0–100.0)
Platelets: 497 10*3/uL — ABNORMAL HIGH (ref 150–400)
RBC: 3.9 MIL/uL — ABNORMAL LOW (ref 4.22–5.81)
RDW: 29.2 % — ABNORMAL HIGH (ref 11.5–15.5)
WBC: 14 10*3/uL — ABNORMAL HIGH (ref 4.0–10.5)
nRBC: 1.2 % — ABNORMAL HIGH (ref 0.0–0.2)

## 2018-11-25 LAB — HEPATITIS A ANTIBODY, TOTAL: Hep A Total Ab: NEGATIVE

## 2018-11-25 LAB — HEPATITIS B SURFACE ANTIBODY, QUANTITATIVE: Hep B S AB Quant (Post): <3.1 m[IU]/mL — ABNORMAL LOW (ref >9.9)

## 2018-11-25 LAB — BASIC METABOLIC PANEL
ANION GAP: 10 (ref 5–15)
BUN: 7 mg/dL (ref 6–20)
CO2: 24 mmol/L (ref 22–32)
Calcium: 8.4 mg/dL — ABNORMAL LOW (ref 8.9–10.3)
Chloride: 100 mmol/L (ref 98–111)
Creatinine, Ser: 0.93 mg/dL (ref 0.61–1.24)
GFR calc Af Amer: >60 mL/min (ref >60)
GFR calc non Af Amer: >60 mL/min (ref >60)
Glucose, Bld: 87 mg/dL (ref 70–99)
Potassium: 3.9 mmol/L (ref 3.5–5.1)
Sodium: 134 mmol/L — ABNORMAL LOW (ref 135–145)

## 2018-11-25 LAB — HEPATITIS C ANTIBODY: HCV Ab: <0.1 s/co ratio (ref 0.0–0.9)

## 2018-11-25 LAB — PROTIME-INR
INR: 2.4
PROTHROMBIN TIME: 25.8 s — AB (ref 11.4–15.2)

## 2018-11-25 LAB — GLUCOSE, CAPILLARY: Glucose-Capillary: 89 mg/dL (ref 70–99)

## 2018-11-25 LAB — HEPATITIS B SURFACE ANTIGEN: Hepatitis B Surface Ag: NEGATIVE

## 2018-11-25 MED ORDER — PEG-KCL-NACL-NASULF-NA ASC-C 100 G PO SOLR
1.0000 | Freq: Once | ORAL | Status: DC
  Filled 2018-11-25: qty 1

## 2018-11-25 MED ORDER — PHYTONADIONE 5 MG PO TABS
2.5000 mg | ORAL_TABLET | Freq: Once | ORAL | Status: AC
  Administered 2018-11-25: 2.5 mg via ORAL
  Filled 2018-11-25: qty 1

## 2018-11-25 MED ORDER — ENSURE ENLIVE PO LIQD
237.0000 mL | Freq: Three times a day (TID) | ORAL | Status: DC
  Administered 2018-11-25: 237 mL via ORAL

## 2018-11-25 NOTE — Progress Notes (Signed)
Ref: Dixie Dials, MD   Subjective:  Feeling better. PT/INR-2.40. Appreciate GI consult. Afebrile.  Objective:  Vital Signs in the last 24 hours: Temp:  [97.8 F (36.6 C)-98.7 F (37.1 C)] 97.8 F (36.6 C) (12/26 0905) Pulse Rate:  [59-65] 59 (12/26 0905) Cardiac Rhythm: Heart block (12/26 0700) Resp:  [18-20] 20 (12/26 0905) BP: (116-136)/(75-93) 116/78 (12/26 0905) SpO2:  [100 %] 100 % (12/26 0905)  Physical Exam: BP Readings from Last 1 Encounters:  11/25/18 116/78     Wt Readings from Last 1 Encounters:  11/23/18 65.4 kg    Weight change:  Body mass index is 18.02 kg/m. HEENT: [place]/AT, Eyes-Brown, PERL, EOMI, Conjunctiva-Pale, Sclera-Non-icteric Neck: No JVD, No bruit, Trachea midline. Lungs:  Clear, Bilateral. Cardiac:  Regular rhythm, normal S1 and S2, no S3. II/VI systolic murmur. Abdomen:  Soft, non-tender. BS present. Extremities:  No edema present. No cyanosis. No clubbing. CNS: AxOx3, Cranial nerves grossly intact, moves all 4 extremities.  Skin: Warm and dry.   Intake/Output from previous day: 12/25 0701 - 12/26 0700 In: 720 [P.O.:720] Out: -     Lab Results: BMET    Component Value Date/Time   NA 134 (L) 11/25/2018 0804   NA 131 (L) 11/23/2018 1551   NA 131 (L) 11/23/2018 1118   K 3.9 11/25/2018 0804   K 3.8 11/23/2018 1551   K 3.6 11/23/2018 1118   CL 100 11/25/2018 0804   CL 96 (L) 11/23/2018 1551   CL 96 (L) 11/23/2018 1118   CO2 24 11/25/2018 0804   CO2 26 11/23/2018 1551   CO2 24 11/23/2018 1118   GLUCOSE 87 11/25/2018 0804   GLUCOSE 118 (H) 11/23/2018 1551   GLUCOSE 112 (H) 11/23/2018 1118   BUN 7 11/25/2018 0804   BUN 10 11/23/2018 1551   BUN 10 11/23/2018 1118   CREATININE 0.93 11/25/2018 0804   CREATININE 0.98 11/23/2018 1551   CREATININE 0.96 11/23/2018 1118   CALCIUM 8.4 (L) 11/25/2018 0804   CALCIUM 8.1 (L) 11/23/2018 1551   CALCIUM 8.2 (L) 11/23/2018 1118   GFRNONAA >60 11/25/2018 0804   GFRNONAA >60 11/23/2018 1551    GFRNONAA >60 11/23/2018 1118   GFRAA >60 11/25/2018 0804   GFRAA >60 11/23/2018 1551   GFRAA >60 11/23/2018 1118   CBC    Component Value Date/Time   WBC 14.0 (H) 11/25/2018 0804   RBC 3.90 (L) 11/25/2018 0804   HGB 9.3 (L) 11/25/2018 0804   HCT 31.2 (L) 11/25/2018 0804   PLT 497 (H) 11/25/2018 0804   MCV 80.0 11/25/2018 0804   MCH 23.8 (L) 11/25/2018 0804   MCHC 29.8 (L) 11/25/2018 0804   RDW 29.2 (H) 11/25/2018 0804   LYMPHSABS 1.0 11/23/2018 1118   MONOABS 1.0 11/23/2018 1118   EOSABS 0.1 11/23/2018 1118   BASOSABS 0.1 11/23/2018 1118   HEPATIC Function Panel Recent Labs    11/22/18 1135 11/23/18 1118 11/23/18 1551  PROT 7.6 7.3 7.3   HEMOGLOBIN A1C No components found for: HGA1C,  MPG CARDIAC ENZYMES Lab Results  Component Value Date   CKTOTAL 129 05/11/2012   CKMB 3.1 05/10/2012   TROPONINI <0.30 05/10/2012   TROPONINI <0.30 05/09/2012   TROPONINI <0.30 05/09/2012   BNP No results for input(s): PROBNP in the last 8760 hours. TSH No results for input(s): TSH in the last 8760 hours. CHOLESTEROL No results for input(s): CHOL in the last 8760 hours.  Scheduled Meds: . sodium chloride   Intravenous Once  . sodium chloride  Intravenous Once  . chlorhexidine  15 mL Mouth Rinse BID  . digoxin  0.0625 mg Oral Daily  . furosemide  20 mg Intravenous Once  . losartan  25 mg Oral Daily  . mouth rinse  15 mL Mouth Rinse q12n4p  . metoprolol tartrate  25 mg Oral BID  . phytonadione  2.5 mg Oral Once  . potassium chloride  10 mEq Oral Daily   Continuous Infusions: PRN Meds:.acetaminophen, ondansetron **OR** ondansetron (ZOFRAN) IV  Assessment/Plan: Chronic systolic left heart failure Severe symptomatic anemia of blood loss and iron deficiency Hypertension H/O paroxysmal atrial fibrillation Valvular heart disease Alcoholic liver disease Polysubstance abuse  Continue vitamin K supplement. Awaiting GI procedure.   LOS: 3 days    Dixie Dials  MD   11/25/2018, 10:35 AM

## 2018-11-25 NOTE — Progress Notes (Signed)
PROGRESS NOTE    TAVIS KRING   HUD:[id number]  DOB: May 27, 1981  DOA: 11/22/2018 PCP: Dixie Dials, MD   Brief Narrative:  Matthew Joseph is a 37 y/o male with h/o alcoholism, HTN, PAF on Coumadin, HFrEF, marijauna abuse who presented for exertional dyspnea.  Found to have Hb of 2.7 in ED (checked twice). INR > 10, Sodium 124, Cr 1.5.  CXR > mild pulm vascular congestion.  Lasix was recently increased by his PCP (Dr Doylene Canard) due to pedal edema from 20 to 40 mg. .   Subjective: No complaints today.     Assessment & Plan:   Principal Problem:   Severe anemia with Iron deficiency  - hemoccult neg currently but had black stools a couple of weeks ago in setting of Coumadin use and alcohol abuse - has been transfused 4 U PRBC  - Hb now ~ 8- 9 and stable - Ferretin 18-  given a dose of Feraheme on 12/24 - I have consulted GI - GI plans on colonoscopy Friday if INR < 1.6   Thrombocytosis - thrombocytosis likely due to severe anemia  Hypercoaguable state - in setting of Coumadin use and INR > 10  - hold Coumadin  -given 1 U FFP and multiple doses of Vit K- goal is to get < 2 to allow GI to scope   Acute on Chronic Systolic CHF , hyponatremia and also pedal edema likely due to venous stasis - likely related to severe anemia- high output cardiac failure - He states his edema gets worse the longer he stands at work (he is a Training and development officer) and goes down by the time he wakes up in the AM. He wears TEDs as well - last ECHO in 2013> 35% to 40%. 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- he also states he was using cocaine back when he was first diagnosed many years ago - at home, on Digoxin, Lopressor and Amlodipine (states he does not feel he needs the Amlodipine and does not take it) - Cozaar started in house by cardiology -have ordered I and O and daily weights- lasix on hold for  now  AKI - Cr 1.5 has improved to 0.9   Leukocytosis - differential shows mildly elevated neutrophils  - no signs of acute infection  PAF - on Dig, Amio and Lopressor- Amio stopped by cardiology during this admission - holding Coumadin   Alcohol abuse/ elevated LFTs/ hypoalbuminemia - still drinking at least 2 12 oz a day- advised to stop this - CT abd/pelvis> Hyperdensity of the liver parenchyma - ? Due to Amio per radiology read- I suspect he may have cirrhosis-  GI to follow on this  Marijuana and nicotine abuse - smokes about 2 cigarettes per day - admits to using Marijauna  NOTE: he states his insurance starts in January.    DVT prophylaxis: INR still elevated Code Status: Full code Family Communication: brother sitting at bedside Disposition Plan: follow on telemetry Consultants:   Cardiology  GI Procedures:   2 D ECHO Left ventricle: The cavity size was normal. There was mild   concentric hypertrophy. Systolic function was mildly reduced. The   estimated ejection fraction was in the range of 45% to 50%.   Diffuse hypokinesis. - Aortic valve: There was mild regurgitation. - Mitral valve: There was moderate regurgitation. - Atrial septum: No defect or patent foramen ovale was identified. -  Tricuspid valve: There was severe regurgitation. - Pulmonary arteries: Systolic pressure was mildly increased. - Pericardium, extracardiac: A small pericardial effusion was   identified circumferential to the heart. There was no evidence of   hemodynamic compromise. Antimicrobials:  Anti-infectives (From admission, onward)   None       Objective: Vitals:   11/24/18 1806 11/24/18 2037 11/25/18 0500 11/25/18 0905  BP: 130/80 123/75 (!) 136/93 116/78  Pulse: 65 61 60 (!) 59  Resp: 18 18 20 20   Temp:  98.7 F (37.1 C) 98.2 F (36.8 C) 97.8 F (36.6 C)  TempSrc:  Oral Oral Oral  SpO2: 100% 100% 100% 100%  Weight:      Height:        Intake/Output Summary (Last  24 hours) at 11/25/2018 1452 Last data filed at 11/25/2018 1300 Gross per 24 hour  Intake 220 ml  Output -  Net 220 ml   Filed Weights   11/22/18 0916 11/22/18 1715 11/23/18 2111  Weight: 66.2 kg 65.5 kg 65.4 kg    Examination: General exam: Appears comfortable  HEENT: PERRLA, oral mucosa moist, no sclera icterus or thrush Respiratory system: Clear to auscultation. Respiratory effort normal. Cardiovascular system: S1 & S2 heard,  No murmurs  Gastrointestinal system: Abdomen soft, non-tender, nondistended. Normal bowel sound. No organomegaly Central nervous system: Alert and oriented. No focal neurological deficits. Extremities: No cyanosis, clubbing or edema Skin: No rashes or ulcers Psychiatry:  Mood & affect appropriate.     Data Reviewed: I have personally reviewed following labs and imaging studies  CBC: Recent Labs  Lab 11/22/18 0922 11/22/18 1135 11/22/18 1139 11/23/18 0026 11/23/18 1118 11/24/18 0613 11/25/18 0804  WBC 16.1* 14.6*  --   --  14.2* 12.8* 14.0*  NEUTROABS 12.1* 10.4*  --   --  11.9*  --   --   HGB 2.9* 2.7* 5.1* 5.8* 8.5* 8.8* 9.3*  HCT 12.2* 11.3* 15.0* 19.4* 27.8* 29.4* 31.2*  MCV 62.2* 63.5*  --   --  79.0* 78.4* 80.0  PLT 540* 502*  --   --  421* 447* 725*   Basic Metabolic Panel: Recent Labs  Lab 11/22/18 0922 11/22/18 1135 11/22/18 1139 11/23/18 1118 11/23/18 1551 11/25/18 0804  NA 123* 123* 125* 131* 131* 134*  K 4.4 4.2 4.2 3.6 3.8 3.9  CL 90* 90* 90* 96* 96* 100  CO2 22 22  --  24 26 24   GLUCOSE 117* 112* 110* 112* 118* 87  BUN 18 19 19 10 10 7   CREATININE 1.54* 1.39* 1.50* 0.96 0.98 0.93  CALCIUM 8.5* 8.2*  --  8.2* 8.1* 8.4*   GFR: Estimated Creatinine Clearance: 98.6 mL/min (by C-G formula based on SCr of 0.93 mg/dL). Liver Function Tests: Recent Labs  Lab 11/22/18 3664 11/22/18 1135 11/23/18 1118 11/23/18 1551  AST 86* 80* 82* 82*  ALT 48* 43 44 43  ALKPHOS 68 65 66 71  BILITOT 2.7* 2.7* 3.7* 3.6*  PROT 8.1  7.6 7.3 7.3  ALBUMIN 2.7* 2.6* 2.5* 2.4*   No results for input(s): LIPASE, AMYLASE in the last 168 hours. No results for input(s): AMMONIA in the last 168 hours. Coagulation Profile: Recent Labs  Lab 11/22/18 1135 11/23/18 0026 11/23/18 1118 11/24/18 0613 11/25/18 0618  INR >10.00* 5.47* 4.98* 3.74 2.40   Cardiac Enzymes: No results for input(s): CKTOTAL, CKMB, CKMBINDEX, TROPONINI in the last 168 hours. BNP (last 3 results) No results for input(s): PROBNP in the last 8760 hours. HbA1C: No results for  input(s): HGBA1C in the last 72 hours. CBG: Recent Labs  Lab 11/22/18 1710  GLUCAP 89   Lipid Profile: No results for input(s): CHOL, HDL, LDLCALC, TRIG, CHOLHDL, LDLDIRECT in the last 72 hours. Thyroid Function Tests: No results for input(s): TSH, T4TOTAL, FREET4, T3FREE, THYROIDAB in the last 72 hours. Anemia Panel: No results for input(s): VITAMINB12, FOLATE, FERRITIN, TIBC, IRON, RETICCTPCT in the last 72 hours. Urine analysis:    Component Value Date/Time   COLORURINE AMBER (A) 05/08/2012 2237   APPEARANCEUR CLOUDY (A) 05/08/2012 2237   LABSPEC 1.020 05/08/2012 2237   PHURINE 5.5 05/08/2012 2237   GLUCOSEU NEGATIVE 05/08/2012 2237   HGBUR NEGATIVE 05/08/2012 2237   BILIRUBINUR MODERATE (A) 05/08/2012 2237   KETONESUR NEGATIVE 05/08/2012 2237   PROTEINUR NEGATIVE 05/08/2012 2237   UROBILINOGEN 1.0 05/08/2012 2237   NITRITE NEGATIVE 05/08/2012 2237   LEUKOCYTESUR NEGATIVE 05/08/2012 2237   Sepsis Labs: @LABRCNTIP (procalcitonin:4,lacticidven:4) ) Recent Results (from the past 240 hour(s))  MRSA PCR Screening     Status: None   Collection Time: 11/22/18  5:24 PM  Result Value Ref Range Status   MRSA by PCR NEGATIVE NEGATIVE Final    Comment:        The GeneXpert MRSA Assay (FDA approved for NASAL specimens only), is one component of a comprehensive MRSA colonization surveillance program. It is not intended to diagnose MRSA infection nor to guide  or monitor treatment for MRSA infections. Performed at [facility], [place] [address] [place],  [address]          Radiology Studies: No results found.    Scheduled Meds: . sodium chloride   Intravenous Once  . sodium chloride   Intravenous Once  . chlorhexidine  15 mL Mouth Rinse BID  . digoxin  0.0625 mg Oral Daily  . furosemide  20 mg Intravenous Once  . losartan  25 mg Oral Daily  . mouth rinse  15 mL Mouth Rinse q12n4p  . metoprolol tartrate  25 mg Oral BID  . potassium chloride  10 mEq Oral Daily   Continuous Infusions:   LOS: 3 days    Time spent in minutes: 35 min    Debbe Odea, MD [facility] Pager: [email or web address] Password [email or web address] 11/25/2018, 2:52 PM

## 2018-11-25 NOTE — Progress Notes (Addendum)
     [facility] Progress Note  CC:  Profound anemia in the setting of supratherapeutic INR  Subjective:  INR 2.4 today.  Hgb 9.3 grams this AM.  Feels good.  Had normal BM.  Objective:  Vital signs in last 24 hours: Temp:  [97.8 F (36.6 C)-98.7 F (37.1 C)] 97.8 F (36.6 C) (12/26 0905) Pulse Rate:  [59-65] 59 (12/26 0905) Resp:  [18-20] 20 (12/26 0905) BP: (116-136)/(75-93) 116/78 (12/26 0905) SpO2:  [100 %] 100 % (12/26 0905) Last BM Date: 11/23/18 General:  Alert, Well-developed, in NAD Heart:  Regular rate and rhythm; no murmurs Pulm:  CTAB.  No W/R/R. Abdomen:  Soft, non-distended.  BS present.  Non-tender.  Extremities:  Without edema. Neurologic:  Alert and oriented x 4;  grossly normal neurologically. Psych:  Alert and cooperative. Normal mood and affect.  Intake/Output from previous day: 12/25 0701 - 12/26 0700 In: 720 [P.O.:720] Out: -   Lab Results: Recent Labs    11/23/18 1118 11/24/18 0613 11/25/18 0804  WBC 14.2* 12.8* 14.0*  HGB 8.5* 8.8* 9.3*  HCT 27.8* 29.4* 31.2*  PLT 421* 447* 497*   BMET Recent Labs    11/23/18 1118 11/23/18 1551 11/25/18 0804  NA 131* 131* 134*  K 3.6 3.8 3.9  CL 96* 96* 100  CO2 24 26 24   GLUCOSE 112* 118* 87  BUN 10 10 7   CREATININE 0.96 0.98 0.93  CALCIUM 8.2* 8.1* 8.4*   LFT Recent Labs    11/23/18 1551  PROT 7.3  ALBUMIN 2.4*  AST 82*  ALT 43  ALKPHOS 71  BILITOT 3.6*   PT/INR Recent Labs    11/24/18 0613 11/25/18 0618  LABPROT 36.5* 25.8*  INR 3.74 2.40   Hepatitis Panel Recent Labs    11/24/18 0613  HEPBSAG Negative   Assessment / Plan: *Iron deficiency anemia:  Hgb 2.7 grams on admission.  Iron studies quite low.  Heme negative but reports intermittent "darker" but not "black" stools over the past few weeks.  Hgb stable s/p 4 units PRBC's. *Coumadin coagulopathy:  INR >10 on admission.  INR had not been checked in about a year.  Down to 2.4 today. *Hyperdensity of the  liver parenchyma such as can be seen with chronic amiodarone therapy.  Also has mildly elevated LFT's with total bili 3.7 and AST 82.  Drinks ETOH although I did not press the issue on how much he drinks.  Hard to determine which of these is causing these abnormalities by imaging.  Per hospitalist, no plans to restart amiodarone for now, which is good.  Needs to stop drinking ETOH as well.  -EGD in AM if INR <1.6. -Monitor Hgb and transfuse further prn.    LOS: 3 days   Laban Emperor.   11/25/2018, 12:37 PM

## 2018-11-25 NOTE — Progress Notes (Signed)
Initial Nutrition Assessment  DOCUMENTATION CODES:   Not applicable  INTERVENTION:   Ensure Enlive po TID, each supplement provides 350 kcal and 20 grams of protein  Snacks TID between meals   NUTRITION DIAGNOSIS:   Increased nutrient needs related to acute illness, chronic illness as evidenced by estimated needs.  GOAL:   Patient will meet greater than or equal to 90% of their needs  MONITOR:   PO intake, Supplement acceptance, Labs, Weight trends  REASON FOR ASSESSMENT:   Consult Assessment of nutrition requirement/status  ASSESSMENT:   37 yo male admitted with severe symptomatic anemia (Hgb 2.7) in setting of coagulopathy in face of chronic a.fib on coumadin, acute on chronic CHF. PMH includes alcoholic hepatitis, polysubtance abues, CHF EF 35%, HTN  EGD and colonoscopy pending.  Pt reports very good appetite, hungry. Pt reports appetite good at home as well. Recorded po intake 100% of meals  No weight today, weight 65.4 kg on 12/24. Pt reports UBW around 140 pounds. Current wt 144 pounds.   Labs: sodium 134 Meds: reviewed   Diet Order:   Diet Order            Diet NPO time specified  Diet effective midnight        Diet Heart Room service appropriate? Yes; Fluid consistency: Thin  Diet effective now              EDUCATION NEEDS:   No education needs have been identified at this time  Skin:  Skin Assessment: Reviewed RN Assessment  Last BM:  12/24  Height:   Ht Readings from Last 1 Encounters:  11/22/18 6\' 3"  (1.905 m)    Weight:   Wt Readings from Last 1 Encounters:  11/23/18 65.4 kg    Ideal Body Weight:  89.1 kg  BMI:  Body mass index is 18.02 kg/m.  Estimated Nutritional Needs:   Kcal:  8251-8984 kcals   Protein:  105-115 g   Fluid:  >/= 2 L  Kerman Passey MS, RD, LDN, CNSC [phone] Pager  [phone] Weekend/On-Call Pager

## 2018-11-25 NOTE — Anesthesia Preprocedure Evaluation (Deleted)
Anesthesia Evaluation    Airway        Dental   Pulmonary Current Smoker,           Cardiovascular hypertension, Pt. on medications and Pt. on home beta blockers + dysrhythmias Atrial Fibrillation   11/23/18 ECHO: EF 45-50%, diffuse hypokinesis, mild AI, mod MR, severe TR   Neuro/Psych    GI/Hepatic (+)     substance abuse  alcohol use, cocaine use and marijuana use, Severe GI bleed   Endo/Other    Renal/GU      Musculoskeletal   Abdominal   Peds  Hematology Coumadin S/p 4u PRBCs   Anesthesia Other Findings   Reproductive/Obstetrics                             Anesthesia Physical Anesthesia Plan  ASA: IV  Anesthesia Plan: MAC   Post-op Pain Management:    Induction:   PONV Risk Score and Plan:   Airway Management Planned:   Additional Equipment:   Intra-op Plan:   Post-operative Plan:   Informed Consent:   Plan Discussed with:   Anesthesia Plan Comments:         Anesthesia Quick Evaluation

## 2018-11-26 ENCOUNTER — Encounter (HOSPITAL_COMMUNITY)
Admission: EM | Disposition: A | Discharge status: Home / Self Care | Payer: Self-pay | Source: Home / Self Care | Attending: Internal Medicine

## 2018-11-26 ENCOUNTER — Encounter (HOSPITAL_COMMUNITY): Payer: Self-pay | Admitting: Certified Registered Nurse Anesthetist

## 2018-11-26 DIAGNOSIS — K7460 Unspecified cirrhosis of liver: Secondary | ICD-10-CM

## 2018-11-26 LAB — TYPE AND SCREEN
ABO/RH(D): B POS
Antibody Screen: NEGATIVE
UNIT DIVISION: 0
Unit division: 0
Unit division: 0
Unit division: 0
Unit division: 0

## 2018-11-26 LAB — BPAM RBC
BLOOD PRODUCT EXPIRATION DATE: 202001202359
Blood Product Expiration Date: 202001202359
Blood Product Expiration Date: 202001222359
Blood Product Expiration Date: 202001222359
Blood Product Expiration Date: 202001222359
ISSUE DATE / TIME: 201912231252
ISSUE DATE / TIME: 201912231837
ISSUE DATE / TIME: 201912240300
ISSUE DATE / TIME: 201912240641
Unit Type and Rh: 7300
Unit Type and Rh: 7300
Unit Type and Rh: 7300
Unit Type and Rh: 7300
Unit Type and Rh: 7300

## 2018-11-26 LAB — PROTIME-INR
INR: 2.14
Prothrombin Time: 23.6 seconds — ABNORMAL HIGH (ref 11.4–15.2)

## 2018-11-26 SURGERY — CANCELLED PROCEDURE
Anesthesia: Monitor Anesthesia Care

## 2018-11-26 MED ORDER — PHYTONADIONE 5 MG PO TABS
5.0000 mg | ORAL_TABLET | Freq: Once | ORAL | Status: AC
  Administered 2018-11-26: 5 mg via ORAL
  Filled 2018-11-26: qty 1

## 2018-11-26 MED ORDER — LOSARTAN POTASSIUM 25 MG PO TABS: 25.0000 mg | ORAL_TABLET | Freq: Every day | ORAL | 3 refills | Status: AC

## 2018-11-26 MED ORDER — ENSURE ENLIVE PO LIQD: 237.0000 mL | ORAL | 3 refills | Status: AC

## 2018-11-26 SURGICAL SUPPLY — 15 items

## 2018-11-26 NOTE — Discharge Summary (Addendum)
Physician Discharge Summary  Matthew Joseph BJY:[id number] DOB: July 07, 1981 DOA: 11/22/2018  PCP: Dixie Dials, MD  Admit date: 11/22/2018 Discharge date: 11/26/2018  Admitted From: home  Disposition:  home   Recommendations for Outpatient Follow-up:  1. F/u INR in 3-4 days please- Coumadin on hold per Dr Doylene Canard 2. CBC next week to f/u on WBC count and platelets 3. PC should give another dose of Feraheme 550 mg in 1-2 wks   Discharge Condition:  stable   Diet recommendation:  Heart healthy  Code Status: Full code Consultants:   Cardiology  GI   Discharge Diagnoses:  Principal Problem:   Severe anemia Active Problems:   Systolic CHF, acute (HCC)   Hyponatremia   Coagulopathy (HCC)   Alcohol abuse   Cirrhosis of liver without ascites (HCC) Elevated LFTs/ hypoalbuminemia  AKI PAF Leukocytosis Hypercoaguable state Marijuana and nicotine abuse  Brief Summary: Matthew Joseph is a 37 y/o male with h/o alcoholism, HTN, PAF on Coumadin, HFrEF, marijauna abuse who presented for exertional dyspnea.  Found to have Hb of 2.7 in ED (checked twice). INR > 10, Sodium 124, Cr 1.5.  CXR > mild pulm vascular congestion.  Lasix was recently increased by his PCP (Dr Doylene Canard) due to pedal edema from 20 to 40 mg. .  Hospital Course:  Principal Problem:   Severe anemia with Iron deficiency  - hemoccult neg currently but had black stools a couple of weeks ago in setting of Coumadin use and alcohol abuse - has been transfused 4 U PRBC  - Hb now ~ 8- 9 and stable - Ferretin 18-  given a dose of Feraheme on 12/24 - I have consulted GI - GI planed for endoscopy if INR < 1.6- despite multiple doses of Vit K, his INR has not come down to a level where he can have the procedures - GI and I have had a discussion with the patient and his family today- he is asking to go home as he has been waiting a number of day- Will have procedures done as an outpt- GI is in agreement -     Thrombocytosis - thrombocytosis likely due to severe anemia  Hypercoaguable state - in setting of Coumadin use and INR > 10  - hold Coumadin  -given 1 U FFP and multiple doses of Vit K- goal is to get < 2 to allow GI to scope   Acute on Chronic Systolic CHF , hyponatremia and also pedal edema likely due to venous stasis - likely related to severe anemia- high output cardiac failure - He states his edema gets worse the longer he stands at work (he is a Training and development officer) and goes down by the time he wakes up in the AM. He wears TEDs as well - last ECHO in 2013> 35% to 40%. 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- he also states he was using cocaine back when he was first diagnosed many years ago - at home, on Digoxin, Lopressor and Amlodipine (states he does not feel he needs the Amlodipine and does not take it) - Cozaar started in house by cardiology -have ordered I and O and daily weights- lasix on hold for now  AKI - Cr 1.5 has improved to 0.9   Leukocytosis - differential shows mildly elevated neutrophils  - no signs of acute infection  PAF - on Dig, Amio and Lopressor- Amio stopped  by cardiology during this admission - holding Coumadin  Alcohol abuse/ elevated LFTs/ hypoalbuminemia - still drinking at least 2 12 oz a day- advised to stop this - CT abd/pelvis> Hyperdensity of the liver parenchyma - ? Due to Amio per radiology read- I suspect he may have cirrhosis-  GI to follow on this  Marijuana and nicotine abuse - smokes about 2 cigarettes per day - admits to using Marijauna  NOTE: he states his insurance starts in January.   Note: Dr Doylene Canard has done the med rec for this patient and plans to follow up as an outpt.    Procedures:   2 D ECHO Left ventricle: The cavity size was normal. There was mild concentric hypertrophy. Systolic function was mildly reduced.  The estimated ejection fraction was in the range of 45% to 50%. Diffuse hypokinesis. - Aortic valve: There was mild regurgitation. - Mitral valve: There was moderate regurgitation. - Atrial septum: No defect or patent foramen ovale was identified. - Tricuspid valve: There was severe regurgitation. - Pulmonary arteries: Systolic pressure was mildly increased. - Pericardium, extracardiac: A small pericardial effusion was identified circumferential to the heart. There was no evidence of hemodynamic compromise.   Discharge Exam: Vitals:   11/25/18 2004 11/26/18 0432  BP: 136/80 129/73  Pulse: 63 (!) 59  Resp: 20 20  Temp: (!) 97.4 F (36.3 C) 98.1 F (36.7 C)  SpO2: 100% 98%   Vitals:   11/25/18 1624 11/25/18 2004 11/26/18 0137 11/26/18 0432  BP: 122/77 136/80  129/73  Pulse: (!) 59 63  (!) 59  Resp: 19 20  20   Temp: 98.6 F (37 C) (!) 97.4 F (36.3 C)  98.1 F (36.7 C)  TempSrc: Oral Oral  Oral  SpO2: 100% 100%  98%  Weight:   (!) 671 kg   Height:        General: Pt is alert, awake, not in acute distress Cardiovascular: RRR, S1/S2 +, no rubs, no gallops Respiratory: CTA bilaterally, no wheezing, no rhonchi Abdominal: Soft, NT, ND, bowel sounds + Extremities: no edema, no cyanosis   Discharge Instructions   Allergies as of 11/26/2018   No Known Allergies     Medication List    STOP taking these medications   magic mouthwash Soln   meloxicam 7.5 MG tablet Commonly known as:  MOBIC   warfarin 4 MG tablet Commonly known as:  COUMADIN     TAKE these medications   acetaminophen 500 MG tablet Commonly known as:  TYLENOL Take 1,000 mg by mouth every 6 (six) hours as needed for pain or fever.   amiodarone 200 MG tablet Commonly known as:  PACERONE Take 1 tablet (200 mg total) by mouth daily.   amLODipine 5 MG tablet Commonly known as:  NORVASC Take 5 mg by mouth daily.   digoxin 0.0625 mg Tabs tablet Commonly known as:  LANOXIN Take 0.5  tablets (0.0625 mg total) by mouth daily.   feeding supplement (ENSURE ENLIVE) Liqd Take 237 mLs by mouth daily.   furosemide 40 MG tablet Commonly known as:  LASIX Take 40 mg by mouth 2 (two) times daily.   guaiFENesin 600 MG 12 hr tablet Commonly known as:  MUCINEX Take 600 mg by mouth 2 (two) times daily as needed for cough or to loosen phlegm.   losartan 25 MG tablet Commonly known as:  COZAAR Take 1 tablet (25 mg total) by mouth daily.   metoprolol tartrate 25 MG tablet Commonly known as:  LOPRESSOR Take  1 tablet (25 mg total) by mouth 2 (two) times daily.   multivitamin with minerals Tabs tablet Take 1 tablet by mouth daily.   potassium chloride 10 MEQ tablet Commonly known as:  K-DUR Take 10 mEq by mouth daily.      Follow-up Information    Dixie Dials, MD. Schedule an appointment as soon as possible for a visit in 1 week(s).   Specialty:  Cardiology Contact information: [place] [address] [phone]          No Known Allergies   Procedures/Studies:   Ct Abdomen Pelvis Wo Contrast  Result Date: 11/22/2018 CLINICAL DATA:  37 year old male with generalized weakness. INR greater than 10 and anemia with decreased hemoglobin. Query retroperitoneal hemorrhage. EXAM: CT ABDOMEN AND PELVIS WITHOUT CONTRAST TECHNIQUE: Multidetector CT imaging of the abdomen and pelvis was performed following the standard protocol without IV contrast. COMPARISON:  Lumbar radiographs 09/14/2006. FINDINGS: Lower chest: Cardiomegaly. No pericardial effusion. Small or trace bilateral layering pleural effusions, but otherwise negative lung bases. Hepatobiliary: Hyperdensity of the liver parenchyma such as can be seen with chronic amiodarone. There is mild gallbladder wall thickening or pericholecystic fluid, but no associated pericholecystic fat stranding. Pancreas: Negative noncontrast pancreas. Spleen: Negative. Adrenals/Urinary Tract: Moderate bilateral  pararenal space and perinephric stranding appears nonspecific. No nephrolithiasis. No hydronephrosis. Negative adrenal glands. Retroperitoneal stranding bilaterally appears symmetric. There is no discrete retroperitoneal hematoma. Unremarkable urinary bladder. Stomach/Bowel: Negative descending and rectosigmoid colon. Transverse colon is mildly redundant. Negative right colon, appendix (coronal image 66) and terminal ileum. No dilated small bowel. There is a linear 15 millimeter hyperdense object in the left lower quadrant which may be in a small bowel loop and on coronal images somewhat resembles dentition (series 6, image 75). The stomach is decompressed. No free air. There is mild generalized mesenteric stranding throughout the abdomen, but no discrete abdominal free fluid. Vascular/Lymphatic: Occasional calcified atherosclerosis. Vascular patency is not evaluated in the absence of IV contrast. No lymphadenopathy. Reproductive: Negative. Other: Small volume of free fluid in the pelvis but with simple fluid density (series 3, image 71). Superimposed mild to moderate nonspecific presacral stranding. Musculoskeletal: No acute osseous abnormality identified. Bone mineralization is within normal limits. Chronic L5-S1 disc degeneration with vacuum disc. IMPRESSION: 1. Negative for retroperitoneal or other discrete hematoma to explain anemia. 2. Positive for cardiomegaly with trace pleural effusions and small volume of simple appearing pelvic free fluid. 3. Generalized abdominal mesenteric and pararenal stranding is nonspecific, as is mild gallbladder wall edema or pericholecystic fluid. This might be related to anasarca, but there is a lack of generalized subcutaneous edema. 4. Hyperdensity of the liver parenchyma such as can be seen with chronic amiodarone therapy. Query liver insufficiency which might also explain #3. 5. A 15 mm hyperdense object in the left abdominal small bowel is nonspecific but somewhat resembles  a tooth. Is there any evidence of recent tooth loss and accidental ingestion. Electronically Signed   By: Genevie Ann M.D.   On: 11/22/2018 16:17   Dg Chest 2 View  Result Date: 11/22/2018 CLINICAL DATA:  Cough and shortness of breath EXAM: CHEST - 2 VIEW COMPARISON:  November 09, 2018 FINDINGS: There is no edema or consolidation. There is cardiomegaly with a degree of pulmonary venous hypertension. No adenopathy. No bone lesions. IMPRESSION: Pulmonary vascular congestion without edema or consolidation. No adenopathy evident. Electronically Signed   By: Lowella Grip III M.D.   On: 11/22/2018 09:37   Dg Chest 2 View  Result Date: 11/09/2018 CLINICAL DATA:  Shortness of breath with physical exertion for 2 days. On Lasix. EXAM: CHEST - 2 VIEW COMPARISON:  Chest radiograph January 16, 2013 FINDINGS: Cardiac silhouette is mild-to-moderately enlarged, increased from prior examination. Mild pulmonary vascular congestion without pleural effusion or focal consolidation. No pneumothorax. Biapical pleural thickening. Soft tissue planes and included osseous structures are non suspicious. IMPRESSION: Cardiomegaly and pulmonary vascular congestion. Electronically Signed   By: Elon Alas M.D.   On: 11/09/2018 19:31      The results of significant diagnostics from this hospitalization (including imaging, microbiology, ancillary and laboratory) are listed below for reference.     Microbiology: Recent Results (from the past 240 hour(s))  MRSA PCR Screening     Status: None   Collection Time: 11/22/18  5:24 PM  Result Value Ref Range Status   MRSA by PCR NEGATIVE NEGATIVE Final    Comment:        The GeneXpert MRSA Assay (FDA approved for NASAL specimens only), is one component of a comprehensive MRSA colonization surveillance program. It is not intended to diagnose MRSA infection nor to guide or monitor treatment for MRSA infections. Performed at [facility], [place] [address],  [place], [place] [address]      Labs: BNP (last 3 results) Recent Labs    11/09/18 2152 11/22/18 0922 11/22/18 1136  BNP 556.0* 726.2* 250.5*   Basic Metabolic Panel: Recent Labs  Lab 11/22/18 0922 11/22/18 1135 11/22/18 1139 11/23/18 1118 11/23/18 1551 11/25/18 0804  NA 123* 123* 125* 131* 131* 134*  K 4.4 4.2 4.2 3.6 3.8 3.9  CL 90* 90* 90* 96* 96* 100  CO2 22 22  --  24 26 24   GLUCOSE 117* 112* 110* 112* 118* 87  BUN 18 19 19 10 10 7   CREATININE 1.54* 1.39* 1.50* 0.96 0.98 0.93  CALCIUM 8.5* 8.2*  --  8.2* 8.1* 8.4*   Liver Function Tests: Recent Labs  Lab 11/22/18 0922 11/22/18 1135 11/23/18 1118 11/23/18 1551  AST 86* 80* 82* 82*  ALT 48* 43 44 43  ALKPHOS 68 65 66 71  BILITOT 2.7* 2.7* 3.7* 3.6*  PROT 8.1 7.6 7.3 7.3  ALBUMIN 2.7* 2.6* 2.5* 2.4*   No results for input(s): LIPASE, AMYLASE in the last 168 hours. No results for input(s): AMMONIA in the last 168 hours. CBC: Recent Labs  Lab 11/22/18 0922 11/22/18 1135 11/22/18 1139 11/23/18 0026 11/23/18 1118 11/24/18 0613 11/25/18 0804  WBC 16.1* 14.6*  --   --  14.2* 12.8* 14.0*  NEUTROABS 12.1* 10.4*  --   --  11.9*  --   --   HGB 2.9* 2.7* 5.1* 5.8* 8.5* 8.8* 9.3*  HCT 12.2* 11.3* 15.0* 19.4* 27.8* 29.4* 31.2*  MCV 62.2* 63.5*  --   --  79.0* 78.4* 80.0  PLT 540* 502*  --   --  421* 447* 497*   Cardiac Enzymes: No results for input(s): CKTOTAL, CKMB, CKMBINDEX, TROPONINI in the last 168 hours. BNP: Invalid input(s): POCBNP CBG: Recent Labs  Lab 11/22/18 1710  GLUCAP 89   D-Dimer No results for input(s): DDIMER in the last 72 hours. Hgb A1c No results for input(s): HGBA1C in the last 72 hours. Lipid Profile No results for input(s): CHOL, HDL, LDLCALC, TRIG, CHOLHDL, LDLDIRECT in the last 72 hours. Thyroid function studies No results for input(s): TSH, T4TOTAL, T3FREE, THYROIDAB in the last 72 hours.  Invalid input(s): FREET3 Anemia work up No results for input(s): VITAMINB12, FOLATE,  FERRITIN, TIBC, IRON, RETICCTPCT in the last 72 hours. Urinalysis    Component Value Date/Time   COLORURINE AMBER (A) 05/08/2012 2237   APPEARANCEUR CLOUDY (A) 05/08/2012 2237   LABSPEC 1.020 05/08/2012 2237   PHURINE 5.5 05/08/2012 2237   GLUCOSEU NEGATIVE 05/08/2012 2237   HGBUR NEGATIVE 05/08/2012 2237   BILIRUBINUR MODERATE (A) 05/08/2012 2237   KETONESUR NEGATIVE 05/08/2012 2237   PROTEINUR NEGATIVE 05/08/2012 2237   UROBILINOGEN 1.0 05/08/2012 2237   NITRITE NEGATIVE 05/08/2012 2237   LEUKOCYTESUR NEGATIVE 05/08/2012 2237   Sepsis Labs Invalid input(s): PROCALCITONIN,  WBC,  LACTICIDVEN Microbiology Recent Results (from the past 240 hour(s))  MRSA PCR Screening     Status: None   Collection Time: 11/22/18  5:24 PM  Result Value Ref Range Status   MRSA by PCR NEGATIVE NEGATIVE Final    Comment:        The GeneXpert MRSA Assay (FDA approved for NASAL specimens only), is one component of a comprehensive MRSA colonization surveillance program. It is not intended to diagnose MRSA infection nor to guide or monitor treatment for MRSA infections. Performed at [facility], [place] [address], [place], [place] [address]      Time coordinating discharge in minutes: 75  SIGNED:   Debbe Odea, MD  [facility] 11/26/2018, 12:53 PM Pager   If 7PM-7AM, please contact night-coverage [email or web address] Password [email or web address]

## 2018-11-26 NOTE — Progress Notes (Signed)
Ref: Dixie Dials, MD   Subjective:  Awaiting GI procedure. INR still high.  Objective:  Vital Signs in the last 24 hours: Temp:  [97.4 F (36.3 C)-98.6 F (37 C)] 98.1 F (36.7 C) (12/27 0432) Pulse Rate:  [59-63] 59 (12/27 0432) Cardiac Rhythm: Heart block (12/27 0700) Resp:  [19-20] 20 (12/27 0432) BP: (122-136)/(73-80) 129/73 (12/27 0432) SpO2:  [98 %-100 %] 98 % (12/27 0432) Weight:  [161 kg] 671 kg (12/27 0137)  Physical Exam: BP Readings from Last 1 Encounters:  11/26/18 129/73     Wt Readings from Last 1 Encounters:  11/26/18 (!) 671 kg    Weight change:  Body mass index is 184.9 kg/m. HEENT: [place]/AT, Eyes-Brown, PERL, EOMI, Conjunctiva-Pale, Sclera-Non-icteric Neck: No JVD, No bruit, Trachea midline. Lungs:  Clear, Bilateral. Cardiac:  Regular rhythm, normal S1 and S2, no S3. II/VI systolic murmur. Abdomen:  Soft, non-tender. BS present. Extremities:  No edema present. No cyanosis. No clubbing. CNS: AxOx3, Cranial nerves grossly intact, moves all 4 extremities.  Skin: Warm and dry.   Intake/Output from previous day: 12/26 0701 - 12/27 0700 In: 240 [P.O.:240] Out: -     Lab Results: BMET    Component Value Date/Time   NA 134 (L) 11/25/2018 0804   NA 131 (L) 11/23/2018 1551   NA 131 (L) 11/23/2018 1118   K 3.9 11/25/2018 0804   K 3.8 11/23/2018 1551   K 3.6 11/23/2018 1118   CL 100 11/25/2018 0804   CL 96 (L) 11/23/2018 1551   CL 96 (L) 11/23/2018 1118   CO2 24 11/25/2018 0804   CO2 26 11/23/2018 1551   CO2 24 11/23/2018 1118   GLUCOSE 87 11/25/2018 0804   GLUCOSE 118 (H) 11/23/2018 1551   GLUCOSE 112 (H) 11/23/2018 1118   BUN 7 11/25/2018 0804   BUN 10 11/23/2018 1551   BUN 10 11/23/2018 1118   CREATININE 0.93 11/25/2018 0804   CREATININE 0.98 11/23/2018 1551   CREATININE 0.96 11/23/2018 1118   CALCIUM 8.4 (L) 11/25/2018 0804   CALCIUM 8.1 (L) 11/23/2018 1551   CALCIUM 8.2 (L) 11/23/2018 1118   GFRNONAA >60 11/25/2018 0804   GFRNONAA >60  11/23/2018 1551   GFRNONAA >60 11/23/2018 1118   GFRAA >60 11/25/2018 0804   GFRAA >60 11/23/2018 1551   GFRAA >60 11/23/2018 1118   CBC    Component Value Date/Time   WBC 14.0 (H) 11/25/2018 0804   RBC 3.90 (L) 11/25/2018 0804   HGB 9.3 (L) 11/25/2018 0804   HCT 31.2 (L) 11/25/2018 0804   PLT 497 (H) 11/25/2018 0804   MCV 80.0 11/25/2018 0804   MCH 23.8 (L) 11/25/2018 0804   MCHC 29.8 (L) 11/25/2018 0804   RDW 29.2 (H) 11/25/2018 0804   LYMPHSABS 1.0 11/23/2018 1118   MONOABS 1.0 11/23/2018 1118   EOSABS 0.1 11/23/2018 1118   BASOSABS 0.1 11/23/2018 1118   HEPATIC Function Panel Recent Labs    11/22/18 1135 11/23/18 1118 11/23/18 1551  PROT 7.6 7.3 7.3   HEMOGLOBIN A1C No components found for: HGA1C,  MPG CARDIAC ENZYMES Lab Results  Component Value Date   CKTOTAL 129 05/11/2012   CKMB 3.1 05/10/2012   TROPONINI <0.30 05/10/2012   TROPONINI <0.30 05/09/2012   TROPONINI <0.30 05/09/2012   BNP No results for input(s): PROBNP in the last 8760 hours. TSH No results for input(s): TSH in the last 8760 hours. CHOLESTEROL No results for input(s): CHOL in the last 8760 hours.  Scheduled Meds: . sodium  chloride   Intravenous Once  . sodium chloride   Intravenous Once  . chlorhexidine  15 mL Mouth Rinse BID  . digoxin  0.0625 mg Oral Daily  . feeding supplement (ENSURE ENLIVE)  237 mL Oral TID BM  . furosemide  20 mg Intravenous Once  . losartan  25 mg Oral Daily  . mouth rinse  15 mL Mouth Rinse q12n4p  . metoprolol tartrate  25 mg Oral BID  . phytonadione  5 mg Oral Once  . potassium chloride  10 mEq Oral Daily   Continuous Infusions: PRN Meds:.acetaminophen, ondansetron **OR** ondansetron (ZOFRAN) IV  Assessment/Plan: Chronic systolic left heart failure Acute on chronic blood loss anemia and iron deficiency anemia HTN H/O paroxysmal atrial fibrillation Valvular heart disease Alcoholic liver disease Polysubstance abuse  Increase vitamin K intake.    LOS: 4 days    Dixie Dials  MD  11/26/2018, 11:26 AM

## 2018-11-26 NOTE — Care Management Note (Signed)
Case Management Note  Patient Details  Name: Matthew Joseph MRN: [id number] Date of Birth: [birth year]  Subjective/Objective:                    Action/Plan:  Per GI note discharge today.   Spoke with MD , no new prescriptions needed.  Expected Discharge Date:  11/26/18               Expected Discharge Plan:  Home/Self Care  In-House Referral:  Financial Counselor  Discharge planning Services  CM Consult, Medication Assistance, [facility] Program  Post Acute Care Choice:  NA Choice offered to:     DME Arranged:  N/A DME Agency:  NA  HH Arranged:  NA HH Agency:     Status of Service:  In process, will continue to follow  If discussed at Long Length of Stay Meetings, dates discussed:    Additional Comments:  Marilu Favre, RN 11/26/2018, 1:13 PM

## 2018-11-26 NOTE — Progress Notes (Cosign Needed)
          Daily Rounding Note  11/26/2018, 12:15 PM  LOS: 4 days   SUBJECTIVE:   Chief complaint: anemia, dark stool.  FOBT negtive     Stools brown to light brown.  No GI distress. Discharged planned today.    OBJECTIVE:         Vital signs in last 24 hours:    Temp:  [97.4 F (36.3 C)-98.6 F (37 C)] 98.1 F (36.7 C) (12/27 0432) Pulse Rate:  [59-63] 59 (12/27 0432) Resp:  [19-20] 20 (12/27 0432) BP: (122-136)/(73-80) 129/73 (12/27 0432) SpO2:  [98 %-100 %] 98 % (12/27 0432) Weight:  [671 kg] 671 kg (12/27 0137) Last BM Date: 11/25/18 Filed Weights   11/22/18 1715 11/23/18 2111 11/26/18 0137  Weight: 65.5 kg 65.4 kg (!) 671 kg   General: looks well   Heart: RRR Chest: clear. No SOB or cough Abdomen: soft, NT, ND.  Active BS  Extremities: no CCE Neuro/Psych:  Oriented x 3.  No deficits.    Intake/Output from previous day: 12/26 0701 - 12/27 0700 In: 240 [P.O.:240] Out: -   Intake/Output this shift: Total I/O In: 240 [P.O.:240] Out: -   Lab Results: Recent Labs    11/24/18 0613 11/25/18 0804  WBC 12.8* 14.0*  HGB 8.8* 9.3*  HCT 29.4* 31.2*  PLT 447* 497*   BMET Recent Labs    11/23/18 1551 11/25/18 0804  NA 131* 134*  K 3.8 3.9  CL 96* 100  CO2 26 24  GLUCOSE 118* 87  BUN 10 7  CREATININE 0.98 0.93  CALCIUM 8.1* 8.4*   LFT Recent Labs    11/23/18 1551  PROT 7.3  ALBUMIN 2.4*  AST 82*  ALT 43  ALKPHOS 71  BILITOT 3.6*   PT/INR Recent Labs    11/25/18 0618 11/26/18 0350  LABPROT 25.8* 23.6*  INR 2.40 2.14   Hepatitis Panel Recent Labs    11/24/18 0613  HEPBSAG Negative  HCVAB <0.1     ASSESMENT:   *   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 Stool dark brown, FOBT negative.  No chronic, acute GI sxs.   Hgb 2.7 >> 9.3.   S/p 4 U PRBCs.    *   Mild LFT elevation.  Hyperdense liver parenchyma on CT on difficult to discern if this is due to  alcohol versus chronic amiodarone therapy.  AST >> ALT suggests ETOH related liver insult.  HCV Ab, hep B surface Ag both negative.      PLAN   *  Home today.  11/14 GI RN visit.  11/28 EGD with Dr Tarri Glenn at [facility].  Will need to hold Coumadin prior to EGD.     *   No PPI.        Azucena Freed  11/26/2018, 12:15 PM Phone [phone]

## 2018-12-06 ENCOUNTER — Telehealth: Payer: Self-pay

## 2018-12-06 NOTE — Telephone Encounter (Signed)
Desiree, This pt is on Warfarin and needs an anticoagulation letter sent to his cardiologist. We tried to reach the pt, but was unable to leave a message for the pt since his phone is not set up for voice mail. Can you please get instructions for his Warfarin and let us know in PV. He is scheduled for his PV on 12/14/2018.  Thanks, Gwyndolyn Saxon in [facility]

## 2018-12-06 NOTE — Telephone Encounter (Signed)
Dr Tarri Glenn, This pt is scheduled for his endo/colon on 12/28/18. After reviewing his chart, he has a complicated medical history!  We can not reach the pt  to see if he is still on Coumadin and if we need cardiac clearance from his cardiologist. He was seen in ED twice in December 2019! Does the pt need an office visit first or will he need to be scheduled at the hospital? Please advise. Thanks Gwyndolyn Saxon in [facility]

## 2018-12-06 NOTE — Telephone Encounter (Signed)
I know this patient well. He was still on warfarin at the time of discharge in December. We knew that we would need to coordinate his anticoagulation with his cardiologist when we saw him in the hospital. He does not need an office visit, he just needs to have his warfarin managed safely in the periprocedure period. Thank you.

## 2018-12-07 ENCOUNTER — Telehealth: Payer: Self-pay | Admitting: Emergency Medicine

## 2018-12-07 NOTE — Telephone Encounter (Signed)
Sent anticoag letter to Dr. Doylene Canard.

## 2018-12-07 NOTE — Telephone Encounter (Signed)
   KYNAN PEASLEY [birth year] [id number]  Dear Dr. Doylene Canard:  We have scheduled the above named patient for a colonoscopy procedure. Our records show that he is on anticoagulation therapy.  Please advise as to whether the patient may come off their therapy of Wafarin 7 days prior to their procedure which is scheduled for 12/28/18.  Please route your response to [facility], CMA or fax response to [phone].  Sincerely,    [facility]

## 2018-12-13 NOTE — Telephone Encounter (Signed)
[phone] fax to send BT form

## 2018-12-14 NOTE — Telephone Encounter (Signed)
Heard from Dr. Doylene Canard, apparently this patient has been off his coumadin since his hospital d/c.

## 2018-12-28 ENCOUNTER — Encounter: Payer: Self-pay | Admitting: Gastroenterology

## 2019-06-22 ENCOUNTER — Other Ambulatory Visit: Payer: Self-pay

## 2019-06-22 DIAGNOSIS — Z20822 Contact with and (suspected) exposure to covid-19: Secondary | ICD-10-CM

## 2019-06-26 LAB — NOVEL CORONAVIRUS, NAA: SARS-CoV-2, NAA: NOT DETECTED

## 2019-09-16 IMAGING — CT CT ABD-PELV W/O CM
2 of 4 series · 14 of 46 positions shown, 16 images · non-contrast
Comparison: Lumbar radiographs 09/14/2006.

CLINICAL DATA: 37-year-old male with generalized weakness. INR
greater than 10 and anemia with decreased hemoglobin. Query
retroperitoneal hemorrhage.

EXAM:
CT ABDOMEN AND PELVIS WITHOUT CONTRAST
TECHNIQUE: Multidetector CT imaging of the abdomen and pelvis was performed
following the standard protocol without IV contrast.

[Series 3: a/p w/o 5mm · axial · non-contrast · 0.66mm/px · z∈[+814,+1204]mm · 11 of 94 slices shown, 13 images]
[im 8/94  soft-tissue]
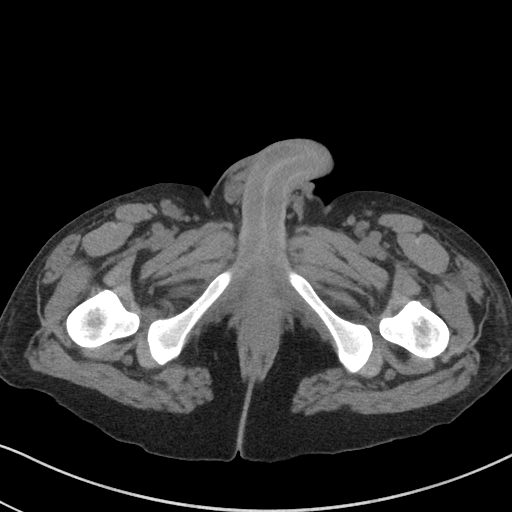
[im 8/94  bone]
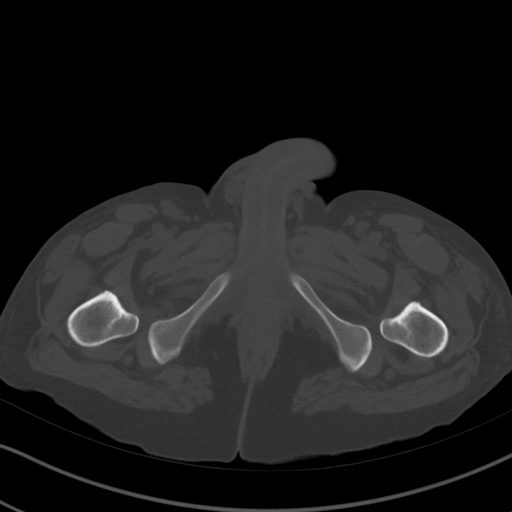
[im 15/94  soft-tissue]
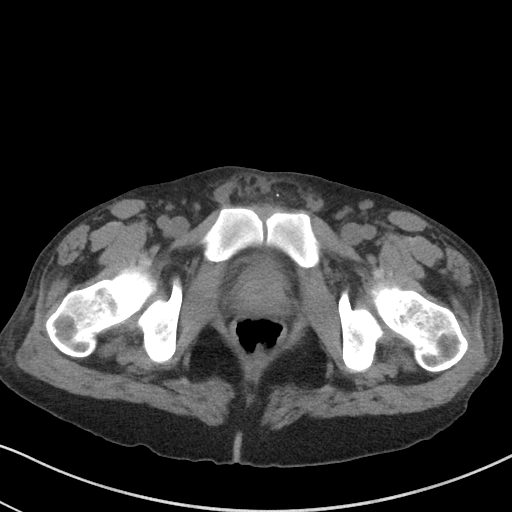
[im 23/94  soft-tissue]
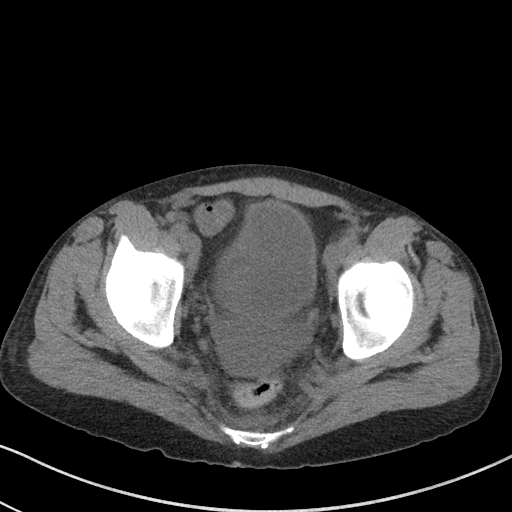
[im 30/94  soft-tissue]
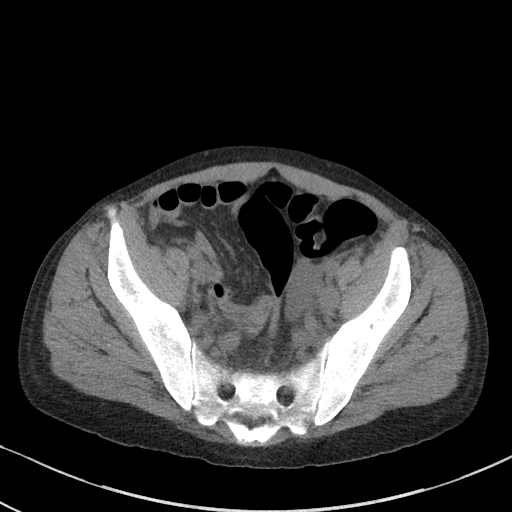
[im 38/94  soft-tissue]
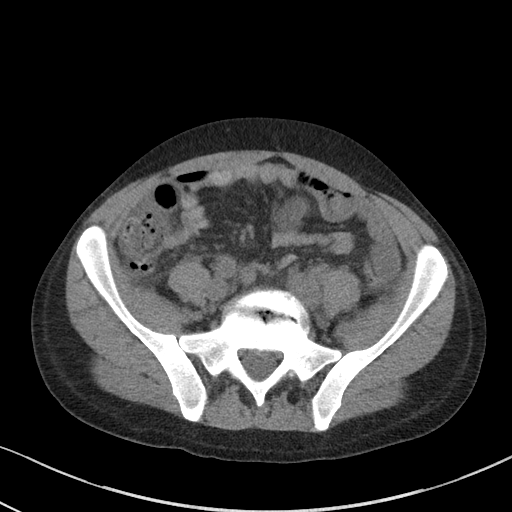
[im 49/94  soft-tissue]
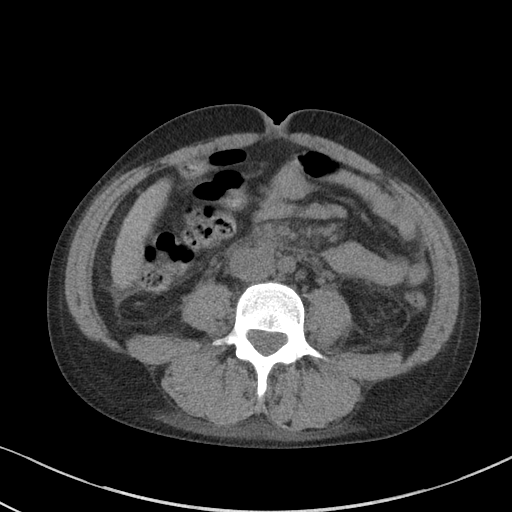
[im 56/94  soft-tissue]
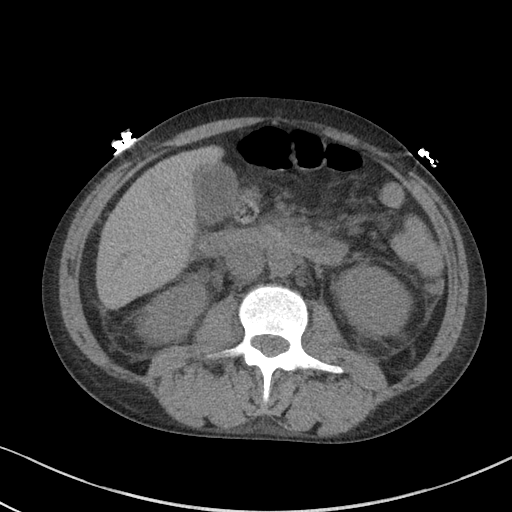
[im 64/94  soft-tissue]
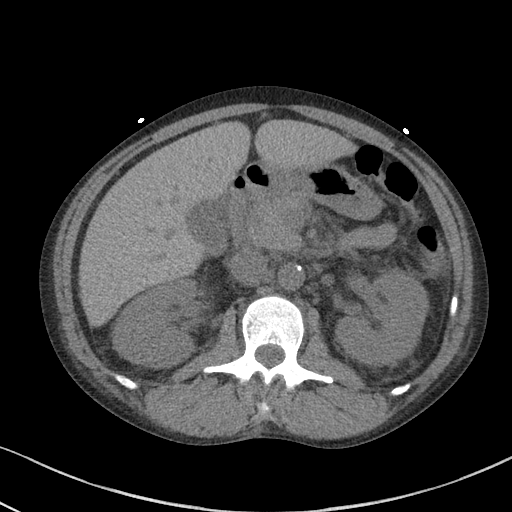
[im 71/94  soft-tissue]
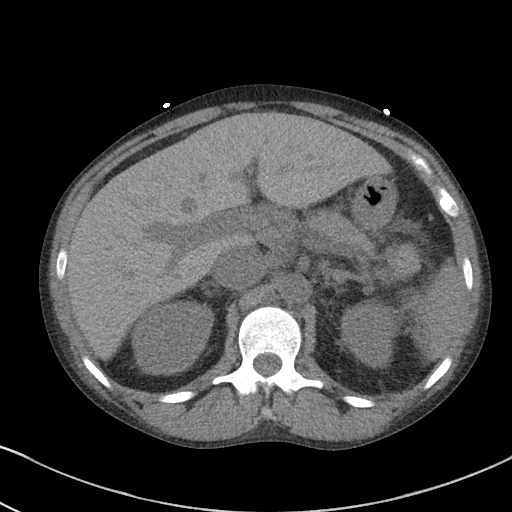
[im 71/94  bone]
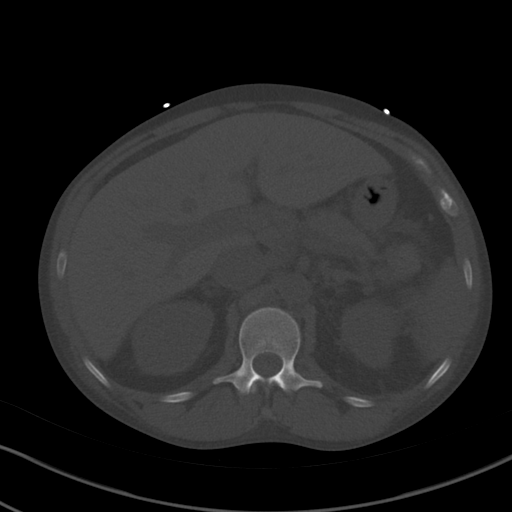
[im 79/94  soft-tissue]
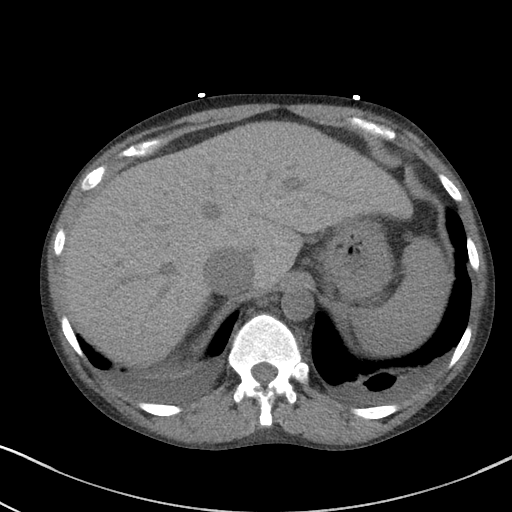
[im 86/94  soft-tissue]
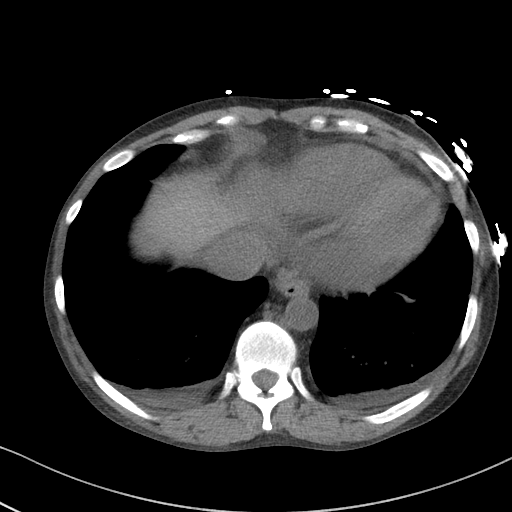

[Series 6: a/p w/o cor · coronal · non-contrast · 0.62mm/px · 3 of 139 slices shown]
[im 47/139  soft-tissue]
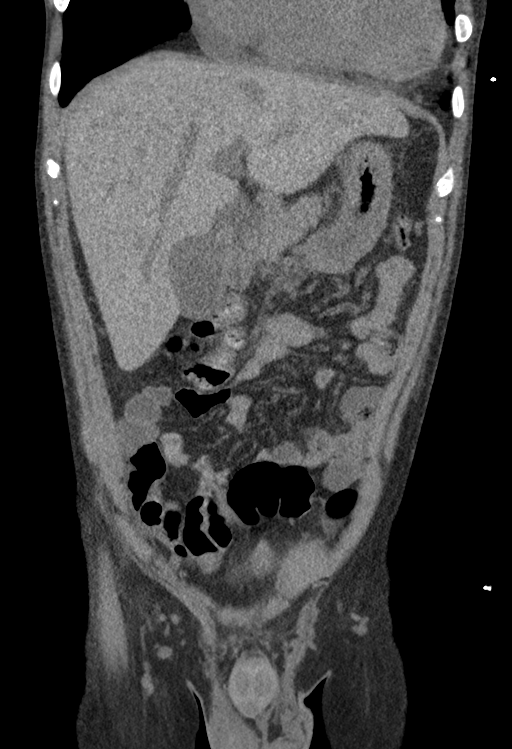
[im 62/139  soft-tissue]
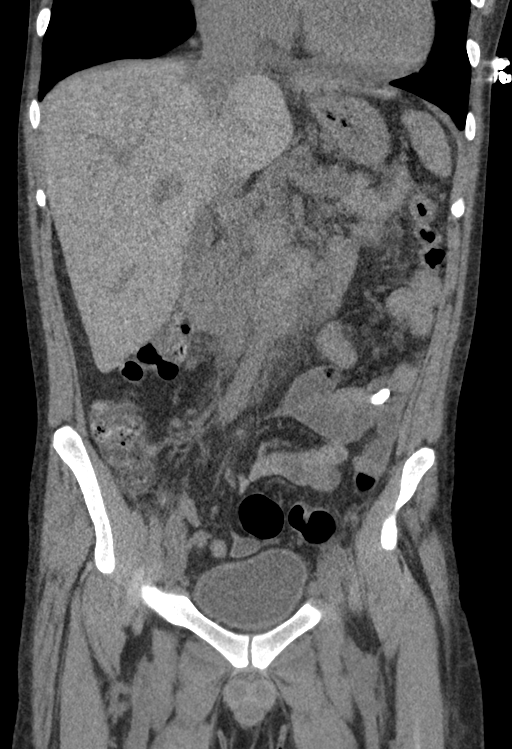
[im 77/139  soft-tissue]
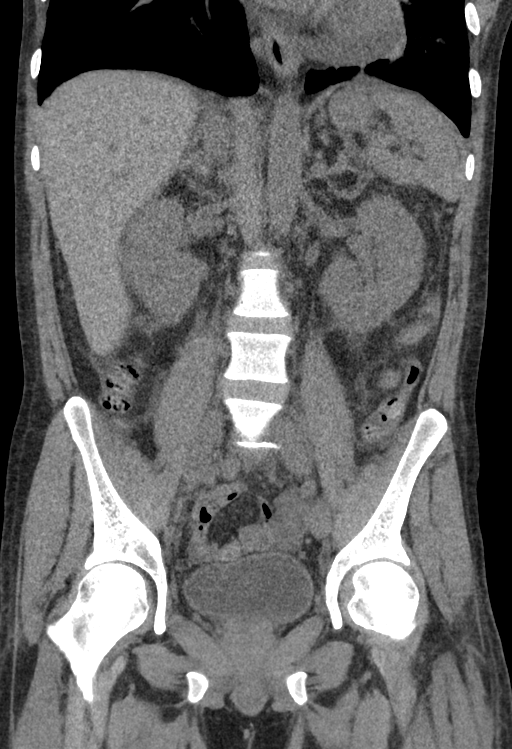

[14 of 46 positions shown; findings below may reference images not displayed]

FINDINGS: Lower chest: Cardiomegaly. No pericardial effusion. Small or trace
bilateral layering pleural effusions, but otherwise negative lung
bases.

Hepatobiliary: Hyperdensity of the liver parenchyma such as can be
seen with chronic amiodarone. There is mild gallbladder wall
thickening or pericholecystic fluid, but no associated
pericholecystic fat stranding.

Pancreas: Negative noncontrast pancreas.

Spleen: Negative.

Adrenals/Urinary Tract: Moderate bilateral pararenal space and
perinephric stranding appears nonspecific. No nephrolithiasis. No
hydronephrosis. Negative adrenal glands. Retroperitoneal stranding
bilaterally appears symmetric. There is no discrete retroperitoneal
hematoma.

Unremarkable urinary bladder.

Stomach/Bowel: Negative descending and rectosigmoid colon.
Transverse colon is mildly redundant. Negative right colon, appendix
(coronal image 66) and terminal ileum.

No dilated small bowel. There is a linear 15 millimeter hyperdense
object in the left lower quadrant which may be in a small bowel loop
and on coronal images somewhat resembles dentition (series 6, image
75). The stomach is decompressed.

No free air. There is mild generalized mesenteric stranding
throughout the abdomen, but no discrete abdominal free fluid.

Vascular/Lymphatic: Occasional calcified atherosclerosis. Vascular
patency is not evaluated in the absence of IV contrast.

No lymphadenopathy.

Reproductive: Negative.

Other: Small volume of free fluid in the pelvis but with simple
fluid density (series 3, image 71). Superimposed mild to moderate
nonspecific presacral stranding.

Musculoskeletal: No acute osseous abnormality identified. Bone
mineralization is within normal limits. Chronic L5-S1 disc
degeneration with vacuum disc.
IMPRESSION: 1. Negative for retroperitoneal or other discrete hematoma to
explain anemia.
2. Positive for cardiomegaly with trace pleural effusions and small
volume of simple appearing pelvic free fluid.

3. Generalized abdominal mesenteric and pararenal stranding is
nonspecific, as is mild gallbladder wall edema or pericholecystic
fluid. This might be related to anasarca, but there is a lack of
generalized subcutaneous edema.
4. Hyperdensity of the liver parenchyma such as can be seen with
chronic amiodarone therapy. Query liver insufficiency which might
also explain #3.
5. A 15 mm hyperdense object in the left abdominal small bowel is
nonspecific but somewhat resembles a tooth. Is there any evidence of
recent tooth loss and accidental ingestion.
